# Patient Record
Sex: Female | Born: 1965 | Race: White | Hispanic: No | Marital: Married | Smoking: Former smoker
Health system: Southern US, Community
[De-identification: ages and names within clinical notes are randomized; demographics above are authoritative.]

## PROBLEM LIST (undated history)

## (undated) DIAGNOSIS — F32A Depression, unspecified: Secondary | ICD-10-CM

## (undated) DIAGNOSIS — T4145XA Adverse effect of unspecified anesthetic, initial encounter: Secondary | ICD-10-CM

## (undated) DIAGNOSIS — F419 Anxiety disorder, unspecified: Secondary | ICD-10-CM

## (undated) DIAGNOSIS — T8859XA Other complications of anesthesia, initial encounter: Secondary | ICD-10-CM

## (undated) DIAGNOSIS — Z9889 Other specified postprocedural states: Secondary | ICD-10-CM

## (undated) DIAGNOSIS — E039 Hypothyroidism, unspecified: Secondary | ICD-10-CM

## (undated) DIAGNOSIS — G709 Myoneural disorder, unspecified: Secondary | ICD-10-CM

## (undated) DIAGNOSIS — R112 Nausea with vomiting, unspecified: Secondary | ICD-10-CM

## (undated) DIAGNOSIS — E119 Type 2 diabetes mellitus without complications: Secondary | ICD-10-CM

## (undated) HISTORY — PX: EYE SURGERY: SHX253

## (undated) HISTORY — PX: HYSTEROSCOPY WITH NOVASURE: SHX5574

## (undated) HISTORY — DX: Depression, unspecified: F32.A

## (undated) HISTORY — PX: TONSILLECTOMY: SUR1361

## (undated) HISTORY — PX: OTHER SURGICAL HISTORY: SHX169

---
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
## 1997-10-01 ENCOUNTER — Other Ambulatory Visit: Admission: RE | Admit: 1997-10-01 | Discharge: 1997-10-01 | Payer: Self-pay | Admitting: Obstetrics & Gynecology

## 2000-10-04 ENCOUNTER — Encounter: Payer: Self-pay | Admitting: Obstetrics and Gynecology

## 2000-10-04 ENCOUNTER — Encounter: Admission: RE | Admit: 2000-10-04 | Discharge: 2000-10-04 | Payer: Self-pay | Admitting: Obstetrics and Gynecology

## 2003-04-22 ENCOUNTER — Other Ambulatory Visit: Admission: RE | Admit: 2003-04-22 | Discharge: 2003-04-22 | Payer: Self-pay | Admitting: Obstetrics and Gynecology

## 2003-12-03 ENCOUNTER — Ambulatory Visit: Payer: Self-pay | Admitting: Family Medicine

## 2004-04-20 ENCOUNTER — Other Ambulatory Visit: Admission: RE | Admit: 2004-04-20 | Discharge: 2004-04-20 | Payer: Self-pay | Admitting: Obstetrics and Gynecology

## 2004-05-31 ENCOUNTER — Ambulatory Visit: Payer: Self-pay | Admitting: Family Medicine

## 2004-07-01 ENCOUNTER — Encounter: Admission: RE | Admit: 2004-07-01 | Discharge: 2004-07-01 | Payer: Self-pay | Admitting: Obstetrics and Gynecology

## 2004-07-09 ENCOUNTER — Encounter: Admission: RE | Admit: 2004-07-09 | Discharge: 2004-07-09 | Payer: Self-pay | Admitting: Obstetrics and Gynecology

## 2004-12-23 ENCOUNTER — Ambulatory Visit: Payer: Self-pay | Admitting: Family Medicine

## 2005-06-17 ENCOUNTER — Other Ambulatory Visit: Admission: RE | Admit: 2005-06-17 | Discharge: 2005-06-17 | Payer: Self-pay | Admitting: Obstetrics and Gynecology

## 2005-07-29 ENCOUNTER — Encounter: Admission: RE | Admit: 2005-07-29 | Discharge: 2005-07-29 | Payer: Self-pay | Admitting: Obstetrics and Gynecology

## 2005-08-09 ENCOUNTER — Encounter: Admission: RE | Admit: 2005-08-09 | Discharge: 2005-08-09 | Payer: Self-pay | Admitting: Obstetrics and Gynecology

## 2005-12-01 ENCOUNTER — Ambulatory Visit (HOSPITAL_COMMUNITY): Admission: RE | Admit: 2005-12-01 | Discharge: 2005-12-01 | Payer: Self-pay | Admitting: Obstetrics and Gynecology

## 2006-02-15 ENCOUNTER — Encounter: Admission: RE | Admit: 2006-02-15 | Discharge: 2006-02-15 | Payer: Self-pay | Admitting: Obstetrics and Gynecology

## 2006-09-21 ENCOUNTER — Encounter: Admission: RE | Admit: 2006-09-21 | Discharge: 2006-09-21 | Payer: Self-pay | Admitting: Obstetrics and Gynecology

## 2007-09-24 ENCOUNTER — Encounter: Admission: RE | Admit: 2007-09-24 | Discharge: 2007-09-24 | Payer: Self-pay | Admitting: Obstetrics and Gynecology

## 2007-09-26 ENCOUNTER — Encounter: Admission: RE | Admit: 2007-09-26 | Discharge: 2007-09-26 | Payer: Self-pay | Admitting: Obstetrics and Gynecology

## 2008-03-18 ENCOUNTER — Encounter (HOSPITAL_COMMUNITY): Admission: RE | Admit: 2008-03-18 | Discharge: 2008-06-16 | Payer: Self-pay | Admitting: Internal Medicine

## 2009-04-21 ENCOUNTER — Encounter: Admission: RE | Admit: 2009-04-21 | Discharge: 2009-04-21 | Payer: Self-pay | Admitting: Obstetrics and Gynecology

## 2010-02-14 ENCOUNTER — Encounter: Payer: Self-pay | Admitting: Internal Medicine

## 2010-04-06 ENCOUNTER — Other Ambulatory Visit: Payer: Self-pay | Admitting: Obstetrics and Gynecology

## 2010-04-06 DIAGNOSIS — Z1231 Encounter for screening mammogram for malignant neoplasm of breast: Secondary | ICD-10-CM

## 2010-04-26 ENCOUNTER — Ambulatory Visit: Payer: Self-pay

## 2010-05-18 ENCOUNTER — Ambulatory Visit
Admission: RE | Admit: 2010-05-18 | Discharge: 2010-05-18 | Disposition: A | Discharge status: Home / Self Care | Payer: PRIVATE HEALTH INSURANCE | Source: Ambulatory Visit | Attending: Obstetrics and Gynecology | Admitting: Obstetrics and Gynecology

## 2010-05-18 DIAGNOSIS — Z1231 Encounter for screening mammogram for malignant neoplasm of breast: Secondary | ICD-10-CM

## 2010-06-11 NOTE — Op Note (Signed)
NAMESHACOYA, BURKHAMMER                 ACCOUNT NO.:  [id number]   MEDICAL RECORD NO.:  [id number]          PATIENT TYPE:  AMB   LOCATION:  SDC                           FACILITY:  [facility]   PHYSICIAN:  Huel Cote, M.D. DATE OF BIRTH:  [birth year]   DATE OF PROCEDURE:  12/01/2005  DATE OF DISCHARGE:                                 OPERATIVE REPORT   PREOPERATIVE DIAGNOSES:  1. Menorrhagia.  2. Abnormal uterine bleeding.   POSTOPERATIVE DIAGNOSES:  1. Menorrhagia.  2. Abnormal uterine bleeding.   PROCEDURE:  Diagnostic hysteroscopy and Novasure endometrial ablation.   SURGEON:  Dr. Huel Cote.   ANESTHESIA:  MAC with a local lidocaine 1% block.   COMPLICATIONS:  None.   ESTIMATED BLOOD LOSS:  Minimal.   HYSTEROSCOPIC DEFICIT:  35 mL.   URINE OUTPUT:  About 50 mL straight cath prior to procedure.   Uterus sounded to approximately 7.5 cm, cervix measured 3 cm.  There is a  normal uterine cavity noted and proliferative appearing endometrium.  After  the procedure there was good blanching noted in the entire fundal area.   PROCEDURE:  The patient was taken to operating room where MAC anesthesia was  obtained without difficulty.  She was then prepped and draped normal sterile  fashion in dorsal lithotomy position.  A speculum was placed within the  vagina and cervix grasped with single toothed tenaculum on the anterior lip  and injected with approximately 17 mL of 1% plain lidocaine for local  cervical block.  The uterus was then sounded and the cervix measured with  measurements as previously stated.  Thus the cavity length was set at 4.5  and the cervix dilated up to approximately 21.  The diagnostic hysteroscope  was then introduced into the uterus and the cavity carefully inspected.  It  was normal shape cavity has had been predetermined on saline infusion  ultrasound.  There did not appear to be any polyps or fibroids or uterine  septation noted.  The endometrium did  appear proliferative.  The cavity  itself was not very large.  The hysteroscope was then removed and the  Novasure unit introduced into the uterine fundus.  It was opened and with  manipulation opened to a cavity width of approximately 3.8.  The test was  then run with the cervical seal  in place and passed and thus the Novasure  unit was activated.  Treatment time was approximately a minute and 27  seconds.  At the conclusion of the treatment the Novasure unit was removed  carefully.  The hysteroscope was reintroduced into the cavity which appeared  well blanched in the entire fundal area.  There was no  evidence of any perforation or injury and therefore the hysteroscope was  removed and the patient had the speculum and tenaculum removed with no  active bleeding noted.  She was then awakened and taken to the recovery room  in stable condition      Huel Cote, M.D.  Electronically Signed     KR/MEDQ  D:  12/01/2005  T:  12/01/2005  Job:  [id number] 

## 2010-06-11 NOTE — H&P (Signed)
Whitney Donovan, Whitney Donovan                 ACCOUNT NO.:  [id number]   MEDICAL RECORD NO.:  [id number]          PATIENT TYPE:  AMB   LOCATION:  SDC                           FACILITY:  [facility]   PHYSICIAN:  Huel Cote, M.D. DATE OF BIRTH:  1965/11/03   DATE OF ADMISSION:  12/01/2005  DATE OF DISCHARGE:                                HISTORY & PHYSICAL   HISTORY:  The patient is a 45 year old G-2,  P-2 who is coming in for a  scheduled NovaSure endometrial ablation, given the finding of abnormal  uterine bleeding and a desire to control this and her menorrhagia symptoms.  The patient has a history of menorrhagia, which she had controlled in the  past with oral contraceptives; however, as she began smoking, she had to  come off her pills, and after that time her periods were approximately 19  days apart, lasting for six to seven days.  The patient had an evaluation in  the office with a saline infusion ultrasound and an endometrial biopsy which  was negative.  The patient at that time was noted to have a normal uterine  cavity, with one small finger fibroid in the uterus, which was less than 1  mm in size.  All else appeared normal.  Given her heavy cycles which are  becoming closer together, the patient wished definitive surgical treatment,  and we discussed the option of endometrial ablation, which she desired to  proceed with.   PAST MEDICAL HISTORY:  None.   PAST SURGICAL HISTORY:  D&C and a C-section x2.   PAST OBSTETRICAL HISTORY:  A C-section x2.   PAST GYN HISTORY:  Remote history of an abnormal Pap smear.   FAMILY HISTORY:  Breast cancer in three aunts.  No colon cancer.   MEDICATIONS:  She is currently on no medications.   ALLERGIES:  PENICILLIN, CODEINE AND AN INTOLERANCE TO NON-STEROIDALS.   PHYSICAL EXAMINATION:  VITAL SIGNS:  Blood pressure 110/80, weight 183  pounds.  HEART:  A regular rate and rhythm.  LUNGS:  Clear.  ABDOMEN:  Soft, nontender.  PELVIC:  She has  normal genitalia noted.  Cervix is normal.  Uterus is  normal in size.  Adnexa have no masses.   The patient was counseled as to the risks and benefits of NovaSure ablation,  including bleeding and possible uterine perforation.  She understands the  risks associated with the procedure.  We also discussed that while this will  likely  not render her completely amenorrheic, that it should shorten and lighten  her cycles considerably and just improvement is our ultimate goal.  The  patient was instructed on how to use Cytotec three to four hours prior to  her treatment and will place this prior to the procedure.      Huel Cote, M.D.  Electronically Signed     KR/MEDQ  D:  11/30/2005  T:  11/30/2005  Job:  [id number]

## 2011-06-14 ENCOUNTER — Other Ambulatory Visit: Payer: Self-pay | Admitting: Obstetrics and Gynecology

## 2011-06-14 DIAGNOSIS — Z1231 Encounter for screening mammogram for malignant neoplasm of breast: Secondary | ICD-10-CM

## 2011-06-30 ENCOUNTER — Ambulatory Visit: Payer: PRIVATE HEALTH INSURANCE

## 2011-07-05 ENCOUNTER — Ambulatory Visit
Admission: RE | Admit: 2011-07-05 | Discharge: 2011-07-05 | Disposition: A | Discharge status: Home / Self Care | Payer: PRIVATE HEALTH INSURANCE | Source: Ambulatory Visit | Attending: Obstetrics and Gynecology | Admitting: Obstetrics and Gynecology

## 2011-07-05 DIAGNOSIS — Z1231 Encounter for screening mammogram for malignant neoplasm of breast: Secondary | ICD-10-CM

## 2011-11-29 ENCOUNTER — Ambulatory Visit: Payer: Self-pay | Admitting: Ophthalmology

## 2012-07-13 ENCOUNTER — Other Ambulatory Visit: Payer: Self-pay

## 2012-07-13 DIAGNOSIS — Z1231 Encounter for screening mammogram for malignant neoplasm of breast: Secondary | ICD-10-CM

## 2012-07-17 ENCOUNTER — Ambulatory Visit
Admission: RE | Admit: 2012-07-17 | Discharge: 2012-07-17 | Disposition: A | Discharge status: Home / Self Care | Payer: PRIVATE HEALTH INSURANCE | Source: Ambulatory Visit

## 2012-07-17 DIAGNOSIS — Z1231 Encounter for screening mammogram for malignant neoplasm of breast: Secondary | ICD-10-CM

## 2013-08-05 ENCOUNTER — Other Ambulatory Visit: Payer: Self-pay

## 2013-08-05 DIAGNOSIS — Z1231 Encounter for screening mammogram for malignant neoplasm of breast: Secondary | ICD-10-CM

## 2013-09-05 ENCOUNTER — Ambulatory Visit
Admission: RE | Admit: 2013-09-05 | Discharge: 2013-09-05 | Disposition: A | Discharge status: Home / Self Care | Payer: PRIVATE HEALTH INSURANCE | Source: Ambulatory Visit

## 2013-09-05 DIAGNOSIS — Z1231 Encounter for screening mammogram for malignant neoplasm of breast: Secondary | ICD-10-CM

## 2014-10-15 ENCOUNTER — Other Ambulatory Visit: Payer: Self-pay

## 2014-10-15 DIAGNOSIS — Z1231 Encounter for screening mammogram for malignant neoplasm of breast: Secondary | ICD-10-CM

## 2014-10-27 ENCOUNTER — Ambulatory Visit
Admission: RE | Admit: 2014-10-27 | Discharge: 2014-10-27 | Disposition: A | Discharge status: Home / Self Care | Payer: 59 | Source: Ambulatory Visit

## 2014-10-27 DIAGNOSIS — Z1231 Encounter for screening mammogram for malignant neoplasm of breast: Secondary | ICD-10-CM

## 2015-09-21 ENCOUNTER — Other Ambulatory Visit: Payer: Self-pay | Admitting: Obstetrics and Gynecology

## 2015-09-21 DIAGNOSIS — Z1231 Encounter for screening mammogram for malignant neoplasm of breast: Secondary | ICD-10-CM

## 2015-10-28 ENCOUNTER — Ambulatory Visit
Admission: RE | Admit: 2015-10-28 | Discharge: 2015-10-28 | Disposition: A | Discharge status: Home / Self Care | Payer: 59 | Source: Ambulatory Visit | Attending: Obstetrics and Gynecology | Admitting: Obstetrics and Gynecology

## 2015-10-28 DIAGNOSIS — Z1231 Encounter for screening mammogram for malignant neoplasm of breast: Secondary | ICD-10-CM

## 2015-11-03 ENCOUNTER — Other Ambulatory Visit: Payer: Self-pay | Admitting: Obstetrics and Gynecology

## 2015-11-03 DIAGNOSIS — R928 Other abnormal and inconclusive findings on diagnostic imaging of breast: Secondary | ICD-10-CM

## 2015-11-06 ENCOUNTER — Ambulatory Visit
Admission: RE | Admit: 2015-11-06 | Discharge: 2015-11-06 | Disposition: A | Discharge status: Home / Self Care | Payer: 59 | Source: Ambulatory Visit | Attending: Obstetrics and Gynecology | Admitting: Obstetrics and Gynecology

## 2015-11-06 DIAGNOSIS — R928 Other abnormal and inconclusive findings on diagnostic imaging of breast: Secondary | ICD-10-CM

## 2016-11-29 ENCOUNTER — Other Ambulatory Visit: Payer: Self-pay | Admitting: Obstetrics and Gynecology

## 2016-11-29 DIAGNOSIS — Z1231 Encounter for screening mammogram for malignant neoplasm of breast: Secondary | ICD-10-CM

## 2016-12-26 ENCOUNTER — Ambulatory Visit: Payer: 59

## 2017-01-12 ENCOUNTER — Ambulatory Visit
Admission: RE | Admit: 2017-01-12 | Discharge: 2017-01-12 | Disposition: A | Discharge status: Home / Self Care | Payer: 59 | Source: Ambulatory Visit | Attending: Obstetrics and Gynecology | Admitting: Obstetrics and Gynecology

## 2017-01-12 DIAGNOSIS — Z1231 Encounter for screening mammogram for malignant neoplasm of breast: Secondary | ICD-10-CM

## 2018-02-20 ENCOUNTER — Other Ambulatory Visit: Payer: Self-pay | Admitting: Obstetrics and Gynecology

## 2018-02-20 DIAGNOSIS — Z1231 Encounter for screening mammogram for malignant neoplasm of breast: Secondary | ICD-10-CM

## 2018-03-13 ENCOUNTER — Ambulatory Visit: Payer: 59

## 2018-04-06 ENCOUNTER — Ambulatory Visit: Payer: 59

## 2018-04-27 ENCOUNTER — Ambulatory Visit: Payer: 59

## 2018-07-03 ENCOUNTER — Ambulatory Visit
Admission: RE | Admit: 2018-07-03 | Discharge: 2018-07-03 | Disposition: A | Discharge status: Home / Self Care | Payer: Managed Care, Other (non HMO) | Source: Ambulatory Visit | Attending: Obstetrics and Gynecology | Admitting: Obstetrics and Gynecology

## 2018-07-03 ENCOUNTER — Other Ambulatory Visit: Payer: Self-pay

## 2018-07-03 DIAGNOSIS — Z1231 Encounter for screening mammogram for malignant neoplasm of breast: Secondary | ICD-10-CM

## 2018-10-08 ENCOUNTER — Other Ambulatory Visit: Payer: Self-pay | Admitting: Specialist

## 2018-10-10 ENCOUNTER — Encounter
Admission: RE | Admit: 2018-10-10 | Discharge: 2018-10-10 | Disposition: A | Discharge status: Home / Self Care | Payer: Managed Care, Other (non HMO) | Source: Ambulatory Visit | Attending: Specialist | Admitting: Specialist

## 2018-10-10 ENCOUNTER — Other Ambulatory Visit: Payer: Self-pay

## 2018-10-10 DIAGNOSIS — Z01818 Encounter for other preprocedural examination: Secondary | ICD-10-CM | POA: Insufficient documentation

## 2018-10-10 DIAGNOSIS — Z20828 Contact with and (suspected) exposure to other viral communicable diseases: Secondary | ICD-10-CM | POA: Insufficient documentation

## 2018-10-10 DIAGNOSIS — G5601 Carpal tunnel syndrome, right upper limb: Secondary | ICD-10-CM | POA: Insufficient documentation

## 2018-10-10 HISTORY — DX: Nausea with vomiting, unspecified: R11.2

## 2018-10-10 HISTORY — DX: Type 2 diabetes mellitus without complications: E11.9

## 2018-10-10 HISTORY — DX: Anxiety disorder, unspecified: F41.9

## 2018-10-10 HISTORY — DX: Other complications of anesthesia, initial encounter: T88.59XA

## 2018-10-10 HISTORY — DX: Other specified postprocedural states: Z98.890

## 2018-10-10 HISTORY — DX: Hypothyroidism, unspecified: E03.9

## 2018-10-10 NOTE — Patient Instructions (Signed)
Your procedure is scheduled on: 10/15/2018 Mon Report to Same Day Surgery 2nd floor medical mall [facility] Entrance-take elevator on left to 2nd floor.  Check in with surgery information desk.) To find out your arrival time please call [phone] between 1PM - 3PM on 10/12/2018 Fri  Remember: Instructions that are not followed completely may result in serious medical risk, up to and including death, or upon the discretion of your surgeon and anesthesiologist your surgery may need to be rescheduled.    _x___ 1. Do not eat food after midnight the night before your procedure. You may drink clear liquids up to 2 hours before you are scheduled to arrive at the hospital for your procedure.  Do not drink clear liquids within 2 hours of your scheduled arrival to the hospital.  Clear liquids include  --Water or Apple juice without pulp  --Clear carbohydrate beverage such as ClearFast or Gatorade  --Black Coffee or Clear Tea (No milk, no creamers, do not add anything to                  the coffee or Tea Type 1 and type 2 diabetics should only drink water.   ____Ensure clear carbohydrate drink on the way to the hospital for bariatric patients  ____Ensure clear carbohydrate drink 3 hours before surgery.   No gum chewing or hard candies.     __x__ 2. No Alcohol for 24 hours before or after surgery.   __x__3. No Smoking or e-cigarettes for 24 prior to surgery.  Do not use any chewable tobacco products for at least 6 hour prior to surgery   ____  4. Bring all medications with you on the day of surgery if instructed.    __x__ 5. Notify your doctor if there is any change in your medical condition     (cold, fever, infections).    x___6. On the morning of surgery brush your teeth with toothpaste and water.  You may rinse your mouth with mouth wash if you wish.  Do not swallow any toothpaste or mouthwash.   Do not wear jewelry, make-up, hairpins, clips or nail polish.  Do not wear lotions,  powders, or perfumes. You may wear deodorant.  Do not shave 48 hours prior to surgery. Men may shave face and neck.  Do not bring valuables to the hospital.    [facility] is not responsible for any belongings or valuables.               Contacts, dentures or bridgework may not be worn into surgery.  Leave your suitcase in the car. After surgery it may be brought to your room.  For patients admitted to the hospital, discharge time is determined by your                       treatment team.  _  Patients discharged the day of surgery will not be allowed to drive home.  You will need someone to drive you home and stay with you the night of your procedure.    Please read over the following fact sheets that you were given:   [facility] Preparing for Surgery and or MRSA Information   _x___ Take anti-hypertensive listed below, cardiac, seizure, asthma,     anti-reflux and psychiatric medicines. These include:  1. thyroid (ARMOUR) 120 MG tablet  2.  3.  4.  5.  6.  ____Fleets enema or Magnesium Citrate as directed.   _x___  Use CHG Soap or sage wipes as directed on instruction sheet   ____ Use inhalers on the day of surgery and bring to hospital day of surgery  _x___ Stop Metformin and Janumet 2 days prior to surgery.    ____ Take 1/2 of usual insulin dose the night before surgery and none on the morning     surgery.   _x___ Follow recommendations from Cardiologist, Pulmonologist or PCP regarding          stopping Aspirin, Coumadin, Plavix ,Eliquis, Effient, or Pradaxa, and Pletal.  X____Stop Anti-inflammatories such as Advil, Aleve, Ibuprofen, Motrin, Naproxen, Naprosyn, Goodies powders or aspirin products. OK to take Tylenol and                          Celebrex.   _x___ Stop supplements until after surgery.  But may continue Vitamin D, Vitamin B,       and multivitamin.   ____ Bring C-Pap to the hospital.

## 2018-10-11 ENCOUNTER — Other Ambulatory Visit: Admission: RE | Admit: 2018-10-11 | Payer: Managed Care, Other (non HMO) | Source: Ambulatory Visit

## 2018-10-11 ENCOUNTER — Other Ambulatory Visit: Payer: Self-pay

## 2018-10-11 ENCOUNTER — Encounter
Admission: RE | Admit: 2018-10-11 | Discharge: 2018-10-11 | Disposition: A | Discharge status: Home / Self Care | Payer: Managed Care, Other (non HMO) | Source: Ambulatory Visit | Attending: Specialist | Admitting: Specialist

## 2018-10-11 DIAGNOSIS — Z20828 Contact with and (suspected) exposure to other viral communicable diseases: Secondary | ICD-10-CM | POA: Diagnosis not present

## 2018-10-11 DIAGNOSIS — G5601 Carpal tunnel syndrome, right upper limb: Secondary | ICD-10-CM | POA: Diagnosis not present

## 2018-10-11 DIAGNOSIS — Z01818 Encounter for other preprocedural examination: Secondary | ICD-10-CM | POA: Diagnosis not present

## 2018-10-11 LAB — SARS CORONAVIRUS 2 (TAT 6-24 HRS): SARS Coronavirus 2: NEGATIVE

## 2018-10-14 MED ORDER — CLINDAMYCIN PHOSPHATE 600 MG/50ML IV SOLN
600.0000 mg | INTRAVENOUS | Status: AC
  Administered 2018-10-15: 10:00:00 600 mg via INTRAVENOUS

## 2018-10-15 ENCOUNTER — Encounter
Admission: RE | Disposition: A | Discharge status: Home / Self Care | Payer: Self-pay | Source: Home / Self Care | Attending: Specialist

## 2018-10-15 ENCOUNTER — Ambulatory Visit
Admission: RE | Admit: 2018-10-15 | Discharge: 2018-10-15 | Disposition: A | Discharge status: Home / Self Care | Payer: Managed Care, Other (non HMO) | Attending: Specialist | Admitting: Specialist

## 2018-10-15 ENCOUNTER — Encounter: Payer: Self-pay | Admitting: *Deleted

## 2018-10-15 ENCOUNTER — Ambulatory Visit: Payer: Managed Care, Other (non HMO) | Admitting: Anesthesiology

## 2018-10-15 ENCOUNTER — Other Ambulatory Visit: Payer: Self-pay

## 2018-10-15 DIAGNOSIS — G5601 Carpal tunnel syndrome, right upper limb: Secondary | ICD-10-CM | POA: Insufficient documentation

## 2018-10-15 DIAGNOSIS — E119 Type 2 diabetes mellitus without complications: Secondary | ICD-10-CM | POA: Diagnosis not present

## 2018-10-15 DIAGNOSIS — F17200 Nicotine dependence, unspecified, uncomplicated: Secondary | ICD-10-CM | POA: Diagnosis not present

## 2018-10-15 HISTORY — PX: CARPAL TUNNEL RELEASE: SHX101

## 2018-10-15 LAB — GLUCOSE, CAPILLARY
Glucose-Capillary: 107 mg/dL — ABNORMAL HIGH (ref 70–99)
Glucose-Capillary: 97 mg/dL (ref 70–99)

## 2018-10-15 LAB — POCT PREGNANCY, URINE: Preg Test, Ur: NEGATIVE

## 2018-10-15 SURGERY — CARPAL TUNNEL RELEASE
Anesthesia: General | Site: Wrist | Laterality: Right

## 2018-10-15 MED ORDER — LIDOCAINE HCL (CARDIAC) PF 100 MG/5ML IV SOSY
PREFILLED_SYRINGE | INTRAVENOUS | Status: DC | PRN
  Administered 2018-10-15: 100 mg via INTRAVENOUS

## 2018-10-15 MED ORDER — LACTATED RINGERS IV SOLN
INTRAVENOUS | Status: DC | PRN
  Administered 2018-10-15: 10:00:00 via INTRAVENOUS

## 2018-10-15 MED ORDER — BUPIVACAINE HCL (PF) 0.5 % IJ SOLN
INTRAMUSCULAR | Status: AC
  Filled 2018-10-15: qty 30

## 2018-10-15 MED ORDER — FENTANYL CITRATE (PF) 100 MCG/2ML IJ SOLN
INTRAMUSCULAR | Status: DC | PRN
  Administered 2018-10-15: 50 ug via INTRAVENOUS

## 2018-10-15 MED ORDER — PROPOFOL 10 MG/ML IV BOLUS
INTRAVENOUS | Status: AC
  Filled 2018-10-15: qty 20

## 2018-10-15 MED ORDER — PHENYLEPHRINE HCL (PRESSORS) 10 MG/ML IV SOLN
INTRAVENOUS | Status: DC | PRN
  Administered 2018-10-15 (×2): 100 ug via INTRAVENOUS

## 2018-10-15 MED ORDER — SCOPOLAMINE 1 MG/3DAYS TD PT72
MEDICATED_PATCH | TRANSDERMAL | Status: AC
  Filled 2018-10-15: qty 1

## 2018-10-15 MED ORDER — DEXAMETHASONE SODIUM PHOSPHATE 10 MG/ML IJ SOLN
INTRAMUSCULAR | Status: DC | PRN
  Administered 2018-10-15: 10 mg via INTRAVENOUS

## 2018-10-15 MED ORDER — MELOXICAM 7.5 MG PO TABS
15.0000 mg | ORAL_TABLET | ORAL | Status: AC
  Administered 2018-10-15: 15 mg via ORAL

## 2018-10-15 MED ORDER — FAMOTIDINE 20 MG PO TABS
20.0000 mg | ORAL_TABLET | Freq: Once | ORAL | Status: AC
  Administered 2018-10-15: 20 mg via ORAL

## 2018-10-15 MED ORDER — SODIUM CHLORIDE 0.9 % IV SOLN
INTRAVENOUS | Status: DC
  Administered 2018-10-15: 09:00:00 via INTRAVENOUS

## 2018-10-15 MED ORDER — PROMETHAZINE HCL 25 MG/ML IJ SOLN: 6.2500 mg | INTRAMUSCULAR | Status: DC | PRN

## 2018-10-15 MED ORDER — FAMOTIDINE 20 MG PO TABS
ORAL_TABLET | ORAL | Status: AC
  Filled 2018-10-15: qty 1

## 2018-10-15 MED ORDER — GABAPENTIN 300 MG PO CAPS
ORAL_CAPSULE | ORAL | Status: AC
  Filled 2018-10-15: qty 1

## 2018-10-15 MED ORDER — FENTANYL CITRATE (PF) 100 MCG/2ML IJ SOLN
INTRAMUSCULAR | Status: AC
  Filled 2018-10-15: qty 2

## 2018-10-15 MED ORDER — GABAPENTIN 300 MG PO CAPS
300.0000 mg | ORAL_CAPSULE | ORAL | Status: AC
  Administered 2018-10-15: 300 mg via ORAL

## 2018-10-15 MED ORDER — SCOPOLAMINE 1 MG/3DAYS TD PT72
1.0000 | MEDICATED_PATCH | TRANSDERMAL | Status: DC
  Administered 2018-10-15: 1.5 mg via TRANSDERMAL

## 2018-10-15 MED ORDER — HYDROCODONE-ACETAMINOPHEN 5-325 MG PO TABS: 1.0000 | ORAL_TABLET | Freq: Four times a day (QID) | ORAL | 0 refills | Status: DC | PRN

## 2018-10-15 MED ORDER — GABAPENTIN 400 MG PO CAPS: 400.0000 mg | ORAL_CAPSULE | Freq: Three times a day (TID) | ORAL | 3 refills | Status: DC

## 2018-10-15 MED ORDER — CLINDAMYCIN PHOSPHATE 600 MG/50ML IV SOLN
INTRAVENOUS | Status: AC
  Filled 2018-10-15: qty 50

## 2018-10-15 MED ORDER — MELOXICAM 7.5 MG PO TABS
ORAL_TABLET | ORAL | Status: AC
  Filled 2018-10-15: qty 2

## 2018-10-15 MED ORDER — ACETAMINOPHEN 10 MG/ML IV SOLN
INTRAVENOUS | Status: AC
  Filled 2018-10-15: qty 100

## 2018-10-15 MED ORDER — ACETAMINOPHEN 10 MG/ML IV SOLN
INTRAVENOUS | Status: DC | PRN
  Administered 2018-10-15: 1000 mg via INTRAVENOUS

## 2018-10-15 MED ORDER — CHLORHEXIDINE GLUCONATE CLOTH 2 % EX PADS: 6.0000 | MEDICATED_PAD | Freq: Once | CUTANEOUS | Status: DC

## 2018-10-15 MED ORDER — MIDAZOLAM HCL 2 MG/2ML IJ SOLN
INTRAMUSCULAR | Status: DC | PRN
  Administered 2018-10-15: 2 mg via INTRAVENOUS

## 2018-10-15 MED ORDER — MIDAZOLAM HCL 2 MG/2ML IJ SOLN
INTRAMUSCULAR | Status: AC
  Filled 2018-10-15: qty 2

## 2018-10-15 MED ORDER — MELOXICAM 15 MG PO TABS: 15.0000 mg | ORAL_TABLET | Freq: Every day | ORAL | 3 refills | Status: DC

## 2018-10-15 MED ORDER — PROPOFOL 10 MG/ML IV BOLUS
INTRAVENOUS | Status: DC | PRN
  Administered 2018-10-15: 25 mg via INTRAVENOUS
  Administered 2018-10-15: 150 mg via INTRAVENOUS

## 2018-10-15 MED ORDER — BUPIVACAINE HCL 0.5 % IJ SOLN
INTRAMUSCULAR | Status: DC | PRN
  Administered 2018-10-15: 7 mL

## 2018-10-15 MED ORDER — ONDANSETRON HCL 4 MG/2ML IJ SOLN
INTRAMUSCULAR | Status: DC | PRN
  Administered 2018-10-15 (×2): 4 mg via INTRAVENOUS

## 2018-10-15 MED ORDER — FENTANYL CITRATE (PF) 100 MCG/2ML IJ SOLN: 25.0000 ug | INTRAMUSCULAR | Status: DC | PRN

## 2018-10-15 SURGICAL SUPPLY — 28 items
BLADE SURG MINI STRL (BLADE) ×2 IMPLANT
BNDG ESMARK 4X12 TAN STRL LF (GAUZE/BANDAGES/DRESSINGS) ×2 IMPLANT
CANISTER SUCT 1200ML W/VALVE (MISCELLANEOUS) ×2 IMPLANT
CHLORAPREP W/TINT 26 (MISCELLANEOUS) ×2 IMPLANT
COVER WAND RF STERILE (DRAPES) ×2 IMPLANT
CUFF TOURN SGL QUICK 18X4 (TOURNIQUET CUFF) IMPLANT
DRSG GAUZE FLUFF 36X18 (GAUZE/BANDAGES/DRESSINGS) ×2 IMPLANT
ELECT REM PT RETURN 9FT ADLT (ELECTROSURGICAL) ×2
ELECTRODE REM PT RTRN 9FT ADLT (ELECTROSURGICAL) ×1 IMPLANT
GAUZE XEROFORM 1X8 LF (GAUZE/BANDAGES/DRESSINGS) ×2 IMPLANT
GLOVE BIO SURGEON STRL SZ8 (GLOVE) ×2 IMPLANT
GOWN STRL REUS W/ TWL LRG LVL3 (GOWN DISPOSABLE) ×1 IMPLANT
GOWN STRL REUS W/TWL LRG LVL3 (GOWN DISPOSABLE) ×1
GOWN STRL REUS W/TWL LRG LVL4 (GOWN DISPOSABLE) ×2 IMPLANT
KIT TURNOVER KIT A (KITS) ×2 IMPLANT
NS IRRIG 500ML POUR BTL (IV SOLUTION) ×2 IMPLANT
PACK EXTREMITY ARMC (MISCELLANEOUS) ×2 IMPLANT
PAD PREP 24X41 OB/GYN DISP (PERSONAL CARE ITEMS) ×2 IMPLANT
PADDING CAST 4IN STRL (MISCELLANEOUS) ×1
PADDING CAST BLEND 4X4 STRL (MISCELLANEOUS) ×1 IMPLANT
SPLINT CAST 1 STEP 3X12 (MISCELLANEOUS) ×2 IMPLANT
STOCKINETTE 48X4 2 PLY STRL (GAUZE/BANDAGES/DRESSINGS) ×1 IMPLANT
STOCKINETTE BIAS CUT 4 980044 (GAUZE/BANDAGES/DRESSINGS) ×2 IMPLANT
STOCKINETTE STRL 4IN 9604848 (GAUZE/BANDAGES/DRESSINGS) ×2 IMPLANT
SUT ETHILON 4-0 (SUTURE) ×1
SUT ETHILON 4-0 FS2 18XMFL BLK (SUTURE) ×1
SUT ETHILON 5-0 FS-2 18 BLK (SUTURE) ×2 IMPLANT
SUTURE ETHLN 4-0 FS2 18XMF BLK (SUTURE) ×1 IMPLANT

## 2018-10-15 NOTE — Addendum Note (Signed)
Addendum  created 10/15/18 1334 by Nelda Marseille, CRNA   Intraprocedure Meds edited

## 2018-10-15 NOTE — Anesthesia Procedure Notes (Signed)
Procedure Name: LMA Insertion Date/Time: 10/15/2018 10:14 AM Performed by: Nelda Marseille, CRNA Pre-anesthesia Checklist: Patient identified, Patient being monitored, Timeout performed, Emergency Drugs available and Suction available Patient Re-evaluated:Patient Re-evaluated prior to induction Oxygen Delivery Method: Circle system utilized Preoxygenation: Pre-oxygenation with 100% oxygen Induction Type: IV induction Ventilation: Mask ventilation without difficulty LMA: LMA inserted LMA Size: 3.5 Tube type: Oral Number of attempts: 1 Placement Confirmation: positive ETCO2 and breath sounds checked- equal and bilateral Tube secured with: Tape Dental Injury: Teeth and Oropharynx as per pre-operative assessment

## 2018-10-15 NOTE — Transfer of Care (Signed)
Immediate Anesthesia Transfer of Care Note  Patient: Whitney Donovan  Procedure(s) Performed: CARPAL TUNNEL RELEASE (Right Wrist)  Patient Location: PACU  Anesthesia Type:General  Level of Consciousness: sedated  Airway & Oxygen Therapy: Patient Spontanous Breathing and Patient connected to face mask oxygen  Post-op Assessment: Report given to RN and Post -op Vital signs reviewed and stable  Post vital signs: Reviewed and stable  Last Vitals:  Vitals Value Taken Time  BP    Temp    Pulse 63 10/15/18 1041  Resp 21 10/15/18 1041  SpO2 99 % 10/15/18 1041  Vitals shown include unvalidated device data.  Last Pain:  Vitals:   10/15/18 0837  TempSrc: Oral  PainSc: 0-No pain         Complications: No apparent anesthesia complications

## 2018-10-15 NOTE — H&P (Signed)
THE PATIENT WAS SEEN PRIOR TO SURGERY TODAY.  HISTORY, ALLERGIES, HOME MEDICATIONS AND OPERATIVE PROCEDURE WERE REVIEWED. RISKS AND BENEFITS OF SURGERY DISCUSSED WITH PATIENT AGAIN.  NO CHANGES FROM INITIAL HISTORY AND PHYSICAL NOTED.    

## 2018-10-15 NOTE — Discharge Instructions (Signed)

## 2018-10-15 NOTE — Anesthesia Postprocedure Evaluation (Signed)
Anesthesia Post Note  Patient: Whitney Donovan  Procedure(s) Performed: CARPAL TUNNEL RELEASE (Right Wrist)  Patient location during evaluation: PACU Anesthesia Type: General Level of consciousness: awake and alert Pain management: pain level controlled Vital Signs Assessment: post-procedure vital signs reviewed and stable Respiratory status: spontaneous breathing, nonlabored ventilation, respiratory function stable and patient connected to nasal cannula oxygen Cardiovascular status: blood pressure returned to baseline and stable Postop Assessment: no apparent nausea or vomiting Anesthetic complications: no     Last Vitals:  Vitals:   10/15/18 1154 10/15/18 1200  BP: 115/60 118/64  Pulse: 68 63  Resp: 16 16  Temp:    SpO2: 97% 96%    Last Pain:  Vitals:   10/15/18 1200  TempSrc:   PainSc: 0-No pain                 Precious Haws Monifa Blanchette

## 2018-10-15 NOTE — Anesthesia Post-op Follow-up Note (Signed)
Anesthesia QCDR form completed.        

## 2018-10-15 NOTE — Anesthesia Preprocedure Evaluation (Signed)
Anesthesia Evaluation  Patient identified by MRN, date of birth, ID band Patient awake    Reviewed: Allergy & Precautions, H&P , NPO status , Patient's Chart, lab work & pertinent test results  History of Anesthesia Complications (+) PONV and history of anesthetic complications  Airway Mallampati: II  TM Distance: >3 FB Neck ROM: full    Dental  (+) Chipped   Pulmonary neg shortness of breath, Current Smoker,           Cardiovascular Exercise Tolerance: Good (-) angina(-) Past MI and (-) DOE negative cardio ROS       Neuro/Psych PSYCHIATRIC DISORDERS negative neurological ROS     GI/Hepatic negative GI ROS, Neg liver ROS, neg GERD  ,  Endo/Other  diabetes, Type 2Hypothyroidism   Renal/GU      Musculoskeletal   Abdominal   Peds  Hematology negative hematology ROS (+)   Anesthesia Other Findings Past Medical History: No date: Anxiety No date: Complication of anesthesia No date: Diabetes mellitus without complication (HCC) No date: Hypothyroidism No date: PONV (postoperative nausea and vomiting)  Past Surgical History: No date: CESAREAN SECTION No date: EYE SURGERY No date: HYSTEROSCOPY WITH NOVASURE No date: TONSILLECTOMY No date: uterine ablation  BMI    Body Mass Index: 26.41 kg/m      Reproductive/Obstetrics negative OB ROS                             Anesthesia Physical Anesthesia Plan  ASA: III  Anesthesia Plan: General LMA   Post-op Pain Management:    Induction: Intravenous  PONV Risk Score and Plan: Dexamethasone, Ondansetron, Midazolam, Treatment may vary due to age or medical condition and Scopolamine patch - Pre-op  Airway Management Planned: LMA  Additional Equipment:   Intra-op Plan:   Post-operative Plan: Extubation in OR  Informed Consent: I have reviewed the patients History and Physical, chart, labs and discussed the procedure including the  risks, benefits and alternatives for the proposed anesthesia with the patient or authorized representative who has indicated his/her understanding and acceptance.     Dental Advisory Given  Plan Discussed with: Anesthesiologist, CRNA and Surgeon  Anesthesia Plan Comments: (Patient consented for risks of anesthesia including but not limited to:  - adverse reactions to medications - damage to teeth, lips or other oral mucosa - sore throat or hoarseness - Damage to heart, brain, lungs or loss of life  Patient voiced understanding.)        Anesthesia Quick Evaluation

## 2018-10-15 NOTE — Op Note (Signed)
10/15/2018  10:36 AM  PATIENT:  Whitney Donovan    PRE-OPERATIVE DIAGNOSIS: RIGHT CARPAL TUNNEL SYNDROME POST-OPERATIVE DIAGNOSIS: RIGHT CARPAL TUNNEL SYNDROME  PROCEDURE:  RIGHT CARPAL TUNNEL RELEASE  SURGEON: Park Breed, MD    ANESTHESIA:   General  TOURNIQUET TIME: 20   MIN  PREOPERATIVE INDICATIONS:  Whitney Donovan is a  53 y.o. female with a diagnosis of right carpal tunnel syndrome who failed conservative measures and elected for surgical management.    The risks benefits and alternatives were discussed with the patient preoperatively including but not limited to the risks of infection, bleeding, nerve injury, incomplete relief of symptoms, pillar pain, cardiopulmonary complications, the need for revision surgery, among others, and the patient was willing to proceed.  OPERATIVE FINDINGS: Thickened volar ligament and nerve compression.  OPERATIVE PROCEDURE: The patient is brought to the operating room placed in the supine position. General anesthesia was administered. The right upper extremity was prepped and draped in usual sterile fashion. Time out was performed. The arm was elevated and exsanguinated and the tourniquet was inflated. Incision was made in line with the radial border of the ring finger. The carpal tunnel transverse fascia was identified, cleaned, and incised sharply. The common sensory branches were visualized along with the superficial palmar arch and protected.  The median nerve was protected below  A Kelly clamp was  placed underneath the transverse carpal ligament, protecting the nerve. I released the ligament completely, and then released the proximal distal volar forearm fascia. The nerve was identified, and visualized, and protected throughout the case. The motor branch was intact upon inspection.  No masses or abnormalities were identified in ulnar bursa.  The wounds were irrigated copiously, and the skin closed with nylon. The wound was injected with 1/2%  marcaine followed by a sterile dressing and  volar splint .  Tourniquet was deflated with good return of blood flow to all fingers. Sponge and needle counts were correct.  The patient tolerated this well, with no complications. The patient was awakened and taken to recovery in good condition.

## 2018-10-16 ENCOUNTER — Encounter: Payer: Self-pay | Admitting: Specialist

## 2018-12-24 ENCOUNTER — Other Ambulatory Visit: Payer: Self-pay | Admitting: Endocrinology

## 2018-12-24 DIAGNOSIS — R591 Generalized enlarged lymph nodes: Secondary | ICD-10-CM

## 2019-01-04 ENCOUNTER — Other Ambulatory Visit: Payer: Self-pay

## 2019-01-04 ENCOUNTER — Ambulatory Visit
Admission: RE | Admit: 2019-01-04 | Discharge: 2019-01-04 | Disposition: A | Discharge status: Home / Self Care | Payer: Managed Care, Other (non HMO) | Source: Ambulatory Visit | Attending: Endocrinology | Admitting: Endocrinology

## 2019-01-04 DIAGNOSIS — R591 Generalized enlarged lymph nodes: Secondary | ICD-10-CM

## 2019-01-04 MED ORDER — IOHEXOL 300 MG/ML  SOLN
75.0000 mL | Freq: Once | INTRAMUSCULAR | Status: AC | PRN
  Administered 2019-01-04: 75 mL via INTRAVENOUS

## 2019-07-18 ENCOUNTER — Telehealth: Payer: Managed Care, Other (non HMO) | Admitting: Physician Assistant

## 2019-07-18 DIAGNOSIS — R21 Rash and other nonspecific skin eruption: Secondary | ICD-10-CM | POA: Diagnosis not present

## 2019-07-18 MED ORDER — TRIAMCINOLONE ACETONIDE 0.025 % EX OINT: 1.0000 "application " | TOPICAL_OINTMENT | Freq: Two times a day (BID) | CUTANEOUS | 0 refills | Status: DC

## 2019-07-18 NOTE — Progress Notes (Signed)
E Visit for Rash   Hi Whitney Donovan,  I am sorry you are not feeling well.  Please go ahead and schedule an appointment with your PCP for this problem.  I have prescribed a medication, but if you are not improving, you will need to be seen in person for a physical exam.   I have prescribed a topical medication for your rash. Apply this twice daily for 1-2 weeks.     HOME CARE:   Take cool showers and avoid direct sunlight.  Apply cool compress or wet dressings.  Take a bath in an oatmeal bath.  Sprinkle content of one Aveeno packet under running faucet with comfortably warm water.  Bathe for 15-20 minutes, 1-2 times daily.  Pat dry with a towel. Do not rub the rash.  Use hydrocortisone cream.  Take an antihistamine like Benadryl for widespread rashes that itch.  The adult dose of Benadryl is 25-50 mg by mouth 4 times daily.  Caution:  This type of medication may cause sleepiness.  Do not drink alcohol, drive, or operate dangerous machinery while taking antihistamines.  Do not take these medications if you have prostate enlargement.  Read package instructions thoroughly on all medications that you take.  GET HELP RIGHT AWAY IF:   Symptoms don't go away after treatment.  Severe itching that persists.  If you rash spreads or swells.  If you rash begins to smell.  If it blisters and opens or develops a yellow-brown crust.  You develop a fever.  You have a sore throat.  You become short of breath.  MAKE SURE YOU:  Understand these instructions. Will watch your condition. Will get help right away if you are not doing well or get worse.  Thank you for choosing an e-visit. Your e-visit answers were reviewed by a board certified advanced clinical practitioner to complete your personal care plan. Depending upon the condition, your plan could have included both over the counter or prescription medications. Please review your pharmacy choice. Be sure that the pharmacy you have chosen  is open so that you can pick up your prescription now.  If there is a problem you may message your provider in MyChart to have the prescription routed to another pharmacy. Your safety is important to [place]. If you have drug allergies check your prescription carefully.  For the next 24 hours, you can use MyChart to ask questions about today's visit, request a non-urgent call back, or ask for a work or school excuse from your e-visit provider. You will get an email in the next two days asking about your experience. I hope that your e-visit has been valuable and will speed your recovery.     Greater than 5 minutes, yet less than 10 minutes of time have been spent researching, coordinating and implementing care for this patient today.

## 2019-07-25 ENCOUNTER — Other Ambulatory Visit: Payer: Self-pay | Admitting: Obstetrics and Gynecology

## 2019-07-25 DIAGNOSIS — Z1231 Encounter for screening mammogram for malignant neoplasm of breast: Secondary | ICD-10-CM

## 2019-07-31 ENCOUNTER — Other Ambulatory Visit: Payer: Self-pay

## 2019-07-31 ENCOUNTER — Ambulatory Visit
Admission: RE | Admit: 2019-07-31 | Discharge: 2019-07-31 | Disposition: A | Discharge status: Home / Self Care | Payer: Managed Care, Other (non HMO) | Source: Ambulatory Visit | Attending: Obstetrics and Gynecology | Admitting: Obstetrics and Gynecology

## 2019-07-31 DIAGNOSIS — Z1231 Encounter for screening mammogram for malignant neoplasm of breast: Secondary | ICD-10-CM

## 2019-10-07 ENCOUNTER — Ambulatory Visit: Admit: 2019-10-07 | Discharge status: Against Medical Advice | Payer: Managed Care, Other (non HMO)

## 2019-12-21 ENCOUNTER — Ambulatory Visit
Admission: EM | Admit: 2019-12-21 | Discharge: 2019-12-21 | Disposition: A | Discharge status: Home / Self Care | Payer: Managed Care, Other (non HMO)

## 2019-12-21 ENCOUNTER — Other Ambulatory Visit: Payer: Self-pay

## 2019-12-21 DIAGNOSIS — H1032 Unspecified acute conjunctivitis, left eye: Secondary | ICD-10-CM

## 2019-12-21 DIAGNOSIS — L03211 Cellulitis of face: Secondary | ICD-10-CM

## 2019-12-21 MED ORDER — SULFAMETHOXAZOLE-TRIMETHOPRIM 800-160 MG PO TABS: 1.0000 | ORAL_TABLET | Freq: Two times a day (BID) | ORAL | 0 refills | Status: AC

## 2019-12-21 MED ORDER — POLYMYXIN B-TRIMETHOPRIM 10000-0.1 UNIT/ML-% OP SOLN: 1.0000 [drp] | Freq: Four times a day (QID) | OPHTHALMIC | 0 refills | Status: AC

## 2019-12-21 NOTE — Discharge Instructions (Signed)
You were seen for eye pain and discomfort and are being treated for an eye infection in the skin infection.   Take the antibiotics as prescribed until they're finished. If you think you're having a reaction, stop the medication, take benadryl and go to the nearest urgent care/emergency room. Take a probiotic while taking the antibiotic to decrease the chances of stomach upset.    Do not touch your eye unless you are putting of eyedrops.  When you use your eyedrops, they should you touch your eyes with clean hands.  Follow-up with your eye specialist as soon as possible.  Take care, Dr. Sharlet Salina, NP-c

## 2019-12-21 NOTE — ED Triage Notes (Signed)
Pt presents to Urgent Care with c/o eye irritation x 2 months. Pt reports she has "tiny white balls that stick to eyelid." States she saw optometrist approx 6 weeks ago and was referred to a specialist who she is scheduled to see in mid-December. Pt states optometrist prescribed an eye drop (cortisone/antiboitic combo), but she has not been able to get Rx filled d/t unavailability of medication. Both eyelids are reddened and lower L periorbital area is swollen and red.

## 2019-12-22 NOTE — ED Provider Notes (Signed)
[facility] [place] [facility] [place]   Name: Whitney Donovan DOB: 12-06-1965 MRN: [id number] CSN: [id number] PCP: Alan Mulder, MD  Arrival date and time:  12/21/19 1310  Chief Complaint:  Eye Problem   NOTE: Prior to seeing the patient today, I have reviewed the triage nursing documentation and vital signs. Clinical staff has updated patient's PMH/PSHx, current medication list, and drug allergies/intolerances to ensure comprehensive history available to assist in medical decision making.   History:   HPI: Whitney Donovan is a 54 y.o. female who presents today with complaints of right eye irritation.  Patient first noticed eye irritation approximately 2 months ago.  She was evaluated by an optometrist 6 weeks ago and received a referral to an eyelid specialist due to "tiny white balls on her eyelids", presumably milia.  She received a prescription for a cortisone/antibiotic combination eyedrop to hold her over until her specialist appointment, however she has been unable to get her prescription filled due to lack of availability.  Over the course of the last 6 weeks, her left eye became more irritated and she continued to scratch and pick on her eyelid.  That subsequently caused some swelling and erythema below her left eye, extending to her cheek.  She has also noticed some warmth to the area.  No drainage noted.  She denies any changes to her vision.   Past Medical History:  Diagnosis Date  . Anxiety   . Complication of anesthesia   . Diabetes mellitus without complication (HCC)   . Hypothyroidism   . PONV (postoperative nausea and vomiting)     Past Surgical History:  Procedure Laterality Date  . CARPAL TUNNEL RELEASE Right 10/15/2018   Procedure: CARPAL TUNNEL RELEASE;  Surgeon: Deeann Saint, MD;  Location: [facility] ORS;  Service: Orthopedics;  Laterality: Right;  . CESAREAN SECTION    . EYE SURGERY    . HYSTEROSCOPY WITH NOVASURE    . TONSILLECTOMY    . uterine ablation       Family History  Problem Relation Age of Onset  . Healthy Mother   . Pulmonary fibrosis Father   . Breast cancer Neg Hx     Social History   Tobacco Use  . Smoking status: Former Smoker    Packs/day: 1.00    Years: 15.00    Pack years: 15.00    Types: Cigarettes  . Smokeless tobacco: Never Used  Vaping Use  . Vaping Use: Never used  Substance Use Topics  . Alcohol use: Never  . Drug use: Never    There are no problems to display for this patient.   Home Medications:    Current Meds  Medication Sig  . citalopram (CELEXA) 10 MG tablet Take 10 mg by mouth daily.    Allergies:   Betadine [povidone iodine], Codeine, and Penicillins  Review of Systems (ROS): Review of Systems  Constitutional: Negative for fatigue and fever.  Eyes: Positive for pain and itching. Negative for photophobia, discharge and visual disturbance.  Skin: Positive for rash.  Neurological: Negative for dizziness and headaches.     Vital Signs: Today's Vitals   12/21/19 1327 12/21/19 1332 12/21/19 1413  BP:  123/81   Pulse:  90   Resp:  18   Temp:  99.2 F (37.3 C)   TempSrc:  Oral   SpO2:  100%   Weight: 167 lb (75.8 kg)    Height: 5' 4.5" (1.638 m)    PainSc: 5   5  Physical Exam: Physical Exam Vitals and nursing note reviewed.  Constitutional:      Appearance: Normal appearance.  Eyes:     General: Vision grossly intact.        Left eye: Discharge present.No foreign body or hordeolum.     Extraocular Movements: Extraocular movements intact.     Conjunctiva/sclera: Conjunctivae normal.     Comments: Possible milia note to bilateral eyelids   Cardiovascular:     Rate and Rhythm: Normal rate and regular rhythm.     Pulses: Normal pulses.     Heart sounds: Normal heart sounds.  Pulmonary:     Breath sounds: Normal breath sounds.  Skin:    General: Skin is warm and dry.       Neurological:     Mental Status: She is alert.  Psychiatric:        Mood and Affect: Mood  normal.        Behavior: Behavior normal.        Thought Content: Thought content normal.      Urgent Care Treatments / Results:   LABS: PLEASE NOTE: all labs that were ordered this encounter are listed, however only abnormal results are displayed. Labs Reviewed - No data to display  EKG: -None  RADIOLOGY: No results found.  PROCEDURES: Procedures  MEDICATIONS RECEIVED THIS VISIT: Medications - No data to display  PERTINENT CLINICAL COURSE NOTES/UPDATES:   Initial Impression / Assessment and Plan / Urgent Care Course:  Pertinent labs & imaging results that were available during my care of the patient were personally reviewed by me and considered in my medical decision making (see lab/imaging section of note for values and interpretations).  Whitney Donovan is a 54 y.o. female who presents to [facility] today with complaints of left eye pain and irritation, diagnosed with left bacterial conjunctivitis and facial cellulitis, and treated as such with the medications below. NP and patient reviewed discharge instructions below during visit.   Patient is well appearing overall in clinic today. She does not appear to be in any acute distress. Presenting symptoms (see HPI) and exam as documented above.   I have reviewed the follow up and strict return precautions for any new or worsening symptoms. Patient is aware of symptoms that would be deemed urgent/emergent, and would thus require further evaluation either here or in the emergency department. At the time of discharge, she verbalized understanding and consent with the discharge plan as it was reviewed with her. All questions were fielded by provider and/or clinic staff prior to patient discharge.    Final Clinical Impressions / Urgent Care Diagnoses:   Final diagnoses:  Acute bacterial conjunctivitis of left eye  Cellulitis of face    New Prescriptions:  [place] Controlled Substance Registry consulted? Not Applicable  Meds  ordered this encounter  Medications  . sulfamethoxazole-trimethoprim (BACTRIM DS) 800-160 MG tablet    Sig: Take 1 tablet by mouth 2 (two) times daily for 7 days.    Dispense:  14 tablet    Refill:  0  . trimethoprim-polymyxin b (POLYTRIM) ophthalmic solution    Sig: Place 1 drop into the left eye every 6 (six) hours for 7 days.    Dispense:  10 mL    Refill:  0      Discharge Instructions     You were seen for eye pain and discomfort and are being treated for an eye infection in the skin infection.   Take the antibiotics as prescribed  until they're finished. If you think you're having a reaction, stop the medication, take benadryl and go to the nearest urgent care/emergency room. Take a probiotic while taking the antibiotic to decrease the chances of stomach upset.    Do not touch your eye unless you are putting of eyedrops.  When you use your eyedrops, they should you touch your eyes with clean hands.  Follow-up with your eye specialist as soon as possible.  Take care, Dr. Sharlet Salina, NP-c     Recommended Follow up Care:  Patient encouraged to follow up with the following provider within the specified time frame, or sooner as dictated by the severity of her symptoms. As always, she was instructed that for any urgent/emergent care needs, she should seek care either here or in the emergency department for more immediate evaluation.   Bailey Mech, DNP, NP-c    Bailey Mech, NP 12/22/19 [phone]

## 2020-09-14 ENCOUNTER — Other Ambulatory Visit: Payer: Self-pay | Admitting: Obstetrics and Gynecology

## 2020-09-14 DIAGNOSIS — Z1231 Encounter for screening mammogram for malignant neoplasm of breast: Secondary | ICD-10-CM

## 2020-10-28 ENCOUNTER — Ambulatory Visit: Payer: Managed Care, Other (non HMO)

## 2020-11-26 ENCOUNTER — Ambulatory Visit
Admission: RE | Admit: 2020-11-26 | Discharge: 2020-11-26 | Disposition: A | Discharge status: Home / Self Care | Payer: Managed Care, Other (non HMO) | Source: Ambulatory Visit | Attending: Obstetrics and Gynecology | Admitting: Obstetrics and Gynecology

## 2020-11-26 ENCOUNTER — Other Ambulatory Visit: Payer: Self-pay

## 2020-11-26 DIAGNOSIS — Z1231 Encounter for screening mammogram for malignant neoplasm of breast: Secondary | ICD-10-CM

## 2021-03-29 LAB — COLOGUARD
COLOGUARD: NEGATIVE
COLOGUARD: NEGATIVE

## 2021-03-29 LAB — EXTERNAL GENERIC LAB PROCEDURE: COLOGUARD: NEGATIVE

## 2021-06-15 ENCOUNTER — Ambulatory Visit (INDEPENDENT_AMBULATORY_CARE_PROVIDER_SITE_OTHER): Payer: BC Managed Care – PPO | Admitting: Podiatry

## 2021-06-15 DIAGNOSIS — B351 Tinea unguium: Secondary | ICD-10-CM | POA: Diagnosis not present

## 2021-06-15 DIAGNOSIS — Z79899 Other long term (current) drug therapy: Secondary | ICD-10-CM | POA: Diagnosis not present

## 2021-06-15 NOTE — Progress Notes (Signed)
Subjective:  Patient ID: Whitney Donovan, female    DOB: [birth year],  MRN: [id number]  Chief Complaint  Patient presents with   Nail Problem    56 y.o. female presents with the above complaint.  Patient presents with complaint left hallux thickened elongated dystrophic toenails x1.  She states that has been present for quite some time is progressive gotten worse.  She wanted get it evaluated.  She has not tried any treatment options for nail fungus.  She has not seen anyone else prior to seeing me.  It does not cause her pain.   Review of Systems: Negative except as noted in the HPI. Denies N/V/F/Ch.  Past Medical History:  Diagnosis Date   Anxiety    Complication of anesthesia    Diabetes mellitus without complication (HCC)    Hypothyroidism    PONV (postoperative nausea and vomiting)     Current Outpatient Medications:    ALPRAZolam (XANAX) 0.5 MG tablet, Take 0.5 mg by mouth at bedtime as needed for anxiety., Disp: , Rfl:    citalopram (CELEXA) 10 MG tablet, Take 10 mg by mouth daily., Disp: , Rfl:    ergocalciferol (VITAMIN D2) 1.25 MG (50000 UT) capsule, Take 50,000 Units by mouth once a week., Disp: , Rfl:    fluorouracil (EFUDEX) 5 % cream, Apply topically 2 (two) times daily., Disp: , Rfl:    furosemide (LASIX) 40 MG tablet, Take 40 mg by mouth., Disp: , Rfl:    gabapentin (NEURONTIN) 400 MG capsule, Take 1 capsule (400 mg total) by mouth 3 (three) times daily., Disp: 60 capsule, Rfl: 3   HYDROcodone-acetaminophen (NORCO) 5-325 MG tablet, Take 1-2 tablets by mouth every 6 (six) hours as needed., Disp: 50 tablet, Rfl: 0   meloxicam (MOBIC) 15 MG tablet, Take 1 tablet (15 mg total) by mouth daily., Disp: 30 tablet, Rfl: 3   metFORMIN (GLUCOPHAGE) 500 MG tablet, Take 1,000 mg by mouth 2 (two) times daily with a meal., Disp: , Rfl:    thyroid (ARMOUR) 120 MG tablet, Take 120 mg by mouth daily before breakfast., Disp: , Rfl:    triamcinolone (KENALOG) 0.025 % ointment, Apply 1  application topically 2 (two) times daily., Disp: 30 g, Rfl: 0  Social History   Tobacco Use  Smoking Status Former   Packs/day: 1.00   Years: 15.00   Pack years: 15.00   Types: Cigarettes  Smokeless Tobacco Never    Allergies  Allergen Reactions   Betadine [Povidone Iodine]     Causes blistering    Codeine Nausea Only   Penicillins Rash   Objective:  There were no vitals filed for this visit. There is no height or weight on file to calculate BMI. Constitutional Well developed. Well nourished.  Vascular Dorsalis pedis pulses palpable bilaterally. Posterior tibial pulses palpable bilaterally. Capillary refill normal to all digits.  No cyanosis or clubbing noted. Pedal hair growth normal.  Neurologic Normal speech. Oriented to person, place, and time. Epicritic sensation to light touch grossly present bilaterally.  Dermatologic Nails thickened elongated dystrophic toenails x1 left hallux.  Mycotic nature to it.  Mild pain on palpation Skin within normal limits  Orthopedic: Normal joint ROM without pain or crepitus bilaterally. No visible deformities. No bony tenderness.   Radiographs: None Assessment:   1. Long-term use of high-risk medication   2. Nail fungus   3. Onychomycosis due to dermatophyte    Plan:  Patient was evaluated and treated and all questions answered.  Left hallux onychomycosis -  Educated the patient on the etiology of onychomycosis and various treatment options associated with improving the fungal load.  I explained to the patient that there is 3 treatment options available to treat the onychomycosis including topical, p.o., laser treatment.  Patient elected to undergo p.o. options with Lamisil/terbinafine therapy.  In order for me to start the medication therapy, I explained to the patient the importance of evaluating the liver and obtaining the liver function test.  Once the liver function test comes back normal I will start him on 8-month course of  Lamisil therapy.  Patient understood all risk and would like to proceed with Lamisil therapy.  I have asked the patient to immediately stop the Lamisil therapy if she has any reactions to it and call the office or go to the emergency room right away.  Patient states understanding   No follow-ups on file.

## 2021-06-16 LAB — HEPATIC FUNCTION PANEL
ALT: 51 IU/L — ABNORMAL HIGH (ref 0–32)
AST: 23 IU/L (ref 0–40)
Albumin: 4.7 g/dL (ref 3.8–4.9)
Alkaline Phosphatase: 70 IU/L (ref 44–121)
Bilirubin Total: 0.3 mg/dL (ref 0.0–1.2)
Bilirubin, Direct: 0.1 mg/dL (ref 0.00–0.40)
Total Protein: 7.4 g/dL (ref 6.0–8.5)

## 2021-10-19 ENCOUNTER — Ambulatory Visit: Payer: BC Managed Care – PPO | Admitting: Podiatry

## 2021-10-27 ENCOUNTER — Other Ambulatory Visit: Payer: Self-pay | Admitting: Obstetrics and Gynecology

## 2021-10-27 DIAGNOSIS — Z1231 Encounter for screening mammogram for malignant neoplasm of breast: Secondary | ICD-10-CM

## 2021-12-02 ENCOUNTER — Ambulatory Visit: Payer: Self-pay

## 2022-01-28 ENCOUNTER — Ambulatory Visit
Admission: RE | Admit: 2022-01-28 | Discharge: 2022-01-28 | Disposition: A | Discharge status: Home / Self Care | Payer: BC Managed Care – PPO | Source: Ambulatory Visit | Attending: Obstetrics and Gynecology | Admitting: Obstetrics and Gynecology

## 2022-01-28 DIAGNOSIS — Z1231 Encounter for screening mammogram for malignant neoplasm of breast: Secondary | ICD-10-CM

## 2022-04-25 ENCOUNTER — Encounter: Payer: Self-pay | Admitting: Internal Medicine

## 2022-05-03 ENCOUNTER — Ambulatory Visit (INDEPENDENT_AMBULATORY_CARE_PROVIDER_SITE_OTHER): Payer: BC Managed Care – PPO | Admitting: Internal Medicine

## 2022-05-03 ENCOUNTER — Encounter: Payer: Self-pay | Admitting: Internal Medicine

## 2022-05-03 VITALS — BP 128/56 | HR 96 | Ht 64.5 in | Wt 151.4 lb

## 2022-05-03 DIAGNOSIS — R1013 Epigastric pain: Secondary | ICD-10-CM | POA: Diagnosis not present

## 2022-05-03 NOTE — Progress Notes (Signed)
Patient ID: Whitney Donovan, female   DOB: 1965-06-20, 57 y.o.   MRN: [id number] HPI: Whitney Donovan is a 57 year old female with a past medical history of hypothyroidism, diabetes and depression who is seen in consult at the request of Dr. Patrecia Pace to evaluate epigastric pain.  She is here alone today.  She reports that in middle December she developed episodic epigastric abdominal pain.  Initial episode starting after eating Whitney Donovan and began about 30 minutes after a meal.  Became intense and the episode lasted about 1/2-hour.  Seem to relieve itself with Pepto-Bismol.  Since this time she has had 3 additional episodes the last was last week.  Each episode has become more intense and lasting a bit longer each time.  Can be moderate to severe.  It is crescendo decrescendo.  Radiates through to the back.  On 1 episode in March it was associated with nausea and vomiting x 1.  She uses Pepto-Bismol each time but she is honestly not sure that this helps at all.  It does make her slightly constipated.  Pain described as a sharp knifelike pain.  She also started herself on famotidine which she takes once daily for the last 2 months but cannot tell if its helped or not.  She started [facility] for diabetes and elevated A1c in August 2023.  She worked up from 2.5 mg to 12.5 mg with each prescription.  She lost 40 pounds.  She stopped this in mid March because she was not sure if it was contributing to the painful episodes described above.  Her doctor also said with her weight loss and A1c having improved to 5.6 the medication was no longer necessary.  She has had Cologuard negative x 2 with the last negative test February 2023  Former smoker none in many years.  No alcohol.  No family history of GI tract malignancy.  2 prior C-sections, and endometrial ablation in 2009 and an orbital decompression surgery in 2018.  She works for [employer].  She does travel with work.  Past Medical History:  Diagnosis Date    Anxiety    Complication of anesthesia    Depression    Diabetes mellitus without complication    Hypothyroidism    PONV (postoperative nausea and vomiting)     Past Surgical History:  Procedure Laterality Date   CARPAL TUNNEL RELEASE Right 10/15/2018   Procedure: CARPAL TUNNEL RELEASE;  Surgeon: Deeann Saint, MD;  Location: [facility] ORS;  Service: Orthopedics;  Laterality: Right;   CESAREAN SECTION     EYE SURGERY     HYSTEROSCOPY WITH NOVASURE     TONSILLECTOMY     uterine ablation      Outpatient Medications Prior to Visit  Medication Sig Dispense Refill   ALPRAZolam (XANAX) 0.5 MG tablet Take 0.5 mg by mouth at bedtime as needed for anxiety.     ergocalciferol (VITAMIN D2) 1.25 MG (50000 UT) capsule Take 50,000 Units by mouth once a week.     famotidine (PEPCID) 20 MG tablet Take 20 mg by mouth daily. Zantac OTC     furosemide (LASIX) 40 MG tablet Take 40 mg by mouth.     metFORMIN (GLUCOPHAGE) 500 MG tablet Take 1,000 mg by mouth 2 (two) times daily with a meal.     NP THYROID 90 MG tablet Take 90 mg by mouth every other day.     thyroid (ARMOUR) 120 MG tablet Take 120 mg by mouth every other day.  ranitidine (ZANTAC) 75 MG tablet Take 75 mg by mouth daily.     MOUNJARO 12.5 MG/0.5ML Pen Inject 12.5 mg into the skin once a week. (Patient not taking: Reported on 05/03/2022)     citalopram (CELEXA) 10 MG tablet Take 10 mg by mouth daily.     fluorouracil (EFUDEX) 5 % cream Apply topically 2 (two) times daily.     gabapentin (NEURONTIN) 400 MG capsule Take 1 capsule (400 mg total) by mouth 3 (three) times daily. 60 capsule 3   HYDROcodone-acetaminophen (NORCO) 5-325 MG tablet Take 1-2 tablets by mouth every 6 (six) hours as needed. 50 tablet 0   meloxicam (MOBIC) 15 MG tablet Take 1 tablet (15 mg total) by mouth daily. 30 tablet 3   triamcinolone (KENALOG) 0.025 % ointment Apply 1 application topically 2 (two) times daily. 30 g 0   No facility-administered medications prior to  visit.    Allergies  Allergen Reactions   Betadine [Povidone Iodine]     Causes blistering    Codeine Nausea Only   Penicillins Rash    Family History  Problem Relation Age of Onset   Healthy Mother    Dementia Mother    Pulmonary fibrosis Father    Breast cancer Neg Hx    Colon cancer Neg Hx    Esophageal cancer Neg Hx     Social History   Tobacco Use   Smoking status: Former    Packs/day: 1.00    Years: 15.00    Additional pack years: 0.00    Total pack years: 15.00    Types: Cigarettes   Smokeless tobacco: Never  Vaping Use   Vaping Use: Never used  Substance Use Topics   Alcohol use: Never   Drug use: Never    ROS: As per history of present illness, otherwise negative  BP (!) 128/56   Pulse 96   Ht 5' 4.5" (1.638 m)   Wt 151 lb 6.4 oz (68.7 kg)   SpO2 97%   BMI 25.59 kg/m  Gen: awake, alert, NAD HEENT: anicteric  CV: RRR, no mrg Pulm: CTA b/l Abd: soft, NT/ND, +BS throughout Ext: no c/c/e Neuro: nonfocal   RELEVANT LABS AND IMAGING:  CMP     Component Value Date/Time   PROT 7.4 06/15/2021 1505   ALBUMIN 4.7 06/15/2021 1505   AST 23 06/15/2021 1505   ALT 51 (H) 06/15/2021 1505   ALKPHOS 70 06/15/2021 1505   BILITOT 0.3 06/15/2021 1505    ASSESSMENT/PLAN: 57 year old female with a past medical history of hypothyroidism, diabetes and depression who is seen in consult at the request of Dr. Patrecia Pace to evaluate epigastric pain.   Epigastric pain --highly suspicious for biliary pain.  I am suspicious for gallstones possibly even exacerbated by fairly significant weight loss of 40 pounds since last summer in the setting of Mounjaro.  Mounjaro now on hold for nearly 3 weeks. -- Abdominal ultrasound -- If negative then HIDA scan with CCK -- If negative EGD -- She can continue famotidine 20 mg daily for now though this would likely not be expected to help gallstone pain.  I did offer other pain medications but for now she declines.  2.  CRC  screening --negative Cologuard x 2 last February 2023.  Cologuard recommended every 3 years versus optical colonoscopy in February 2026.    GY:[id number], Delsa Sale, Md [address] [place],  [place] [address]

## 2022-05-03 NOTE — Patient Instructions (Signed)
You have been scheduled for an abdominal ultrasound at [facility] (1st floor of hospital) on 05/09/2022 at 10:00am. Please arrive 15 minutes prior to your appointment for registration. Make certain not to have anything to eat or drink 6 hours prior to your appointment. Should you need to reschedule your appointment, please contact radiology at [phone]. This test typically takes about 30 minutes to perform.   _______________________________________________________  If your blood pressure at your visit was 140/90 or greater, please contact your primary care physician to follow up on this.  _______________________________________________________  If you are age 16 or older, your body mass index should be between 23-30. Your Body mass index is 25.59 kg/m. If this is out of the aforementioned range listed, please consider follow up with your Primary Care Provider.  If you are age 13 or younger, your body mass index should be between 19-25. Your Body mass index is 25.59 kg/m. If this is out of the aformentioned range listed, please consider follow up with your Primary Care Provider.   ________________________________________________________  The [facility] providers would like to encourage you to use [facility] to communicate with providers for non-urgent requests or questions.  Due to long hold times on the telephone, sending your provider a message by [facility] may be a faster and more efficient way to get a response.  Please allow 48 business hours for a response.  Please remember that this is for non-urgent requests.  _______________________________________________________ It was a pleasure to see you today!  Thank you for trusting me with your gastrointestinal care!

## 2022-05-09 ENCOUNTER — Ambulatory Visit (HOSPITAL_COMMUNITY)
Admission: RE | Admit: 2022-05-09 | Discharge: 2022-05-09 | Disposition: A | Discharge status: Home / Self Care | Payer: BC Managed Care – PPO | Source: Ambulatory Visit | Attending: Internal Medicine | Admitting: Internal Medicine

## 2022-05-09 DIAGNOSIS — R1013 Epigastric pain: Secondary | ICD-10-CM | POA: Diagnosis present

## 2022-05-23 ENCOUNTER — Other Ambulatory Visit: Payer: Self-pay | Admitting: General Surgery

## 2022-05-23 DIAGNOSIS — Z9889 Other specified postprocedural states: Secondary | ICD-10-CM

## 2022-05-23 DIAGNOSIS — K8020 Calculus of gallbladder without cholecystitis without obstruction: Secondary | ICD-10-CM

## 2022-06-03 ENCOUNTER — Observation Stay (HOSPITAL_COMMUNITY)
Admission: EM | Admit: 2022-06-03 | Discharge: 2022-06-05 | Disposition: A | Discharge status: Home / Self Care | Payer: BC Managed Care – PPO | Attending: General Surgery | Admitting: General Surgery

## 2022-06-03 ENCOUNTER — Other Ambulatory Visit: Payer: Self-pay

## 2022-06-03 DIAGNOSIS — K8020 Calculus of gallbladder without cholecystitis without obstruction: Secondary | ICD-10-CM | POA: Diagnosis present

## 2022-06-03 DIAGNOSIS — K8010 Calculus of gallbladder with chronic cholecystitis without obstruction: Secondary | ICD-10-CM | POA: Diagnosis not present

## 2022-06-03 DIAGNOSIS — Z79899 Other long term (current) drug therapy: Secondary | ICD-10-CM | POA: Insufficient documentation

## 2022-06-03 DIAGNOSIS — Z87891 Personal history of nicotine dependence: Secondary | ICD-10-CM | POA: Diagnosis not present

## 2022-06-03 DIAGNOSIS — E039 Hypothyroidism, unspecified: Secondary | ICD-10-CM | POA: Insufficient documentation

## 2022-06-03 DIAGNOSIS — E119 Type 2 diabetes mellitus without complications: Secondary | ICD-10-CM | POA: Diagnosis not present

## 2022-06-03 DIAGNOSIS — Z7984 Long term (current) use of oral hypoglycemic drugs: Secondary | ICD-10-CM | POA: Insufficient documentation

## 2022-06-03 DIAGNOSIS — R1011 Right upper quadrant pain: Secondary | ICD-10-CM | POA: Diagnosis present

## 2022-06-03 DIAGNOSIS — R112 Nausea with vomiting, unspecified: Secondary | ICD-10-CM

## 2022-06-03 LAB — COMPREHENSIVE METABOLIC PANEL
ALT: 18 U/L (ref 0–44)
AST: 16 U/L (ref 15–41)
Albumin: 4.3 g/dL (ref 3.5–5.0)
Alkaline Phosphatase: 56 U/L (ref 38–126)
Anion gap: 13 (ref 5–15)
BUN: 11 mg/dL (ref 6–20)
CO2: 18 mmol/L — ABNORMAL LOW (ref 22–32)
Calcium: 9 mg/dL (ref 8.9–10.3)
Chloride: 107 mmol/L (ref 98–111)
Creatinine, Ser: 0.71 mg/dL (ref 0.44–1.00)
GFR, Estimated: >60 mL/min (ref >60)
Glucose, Bld: 91 mg/dL (ref 70–99)
Potassium: 3.5 mmol/L (ref 3.5–5.1)
Sodium: 138 mmol/L (ref 135–145)
Total Bilirubin: 0.4 mg/dL (ref 0.3–1.2)
Total Protein: 7.5 g/dL (ref 6.5–8.1)

## 2022-06-03 LAB — URINALYSIS, ROUTINE W REFLEX MICROSCOPIC
Bilirubin Urine: NEGATIVE
Glucose, UA: NEGATIVE mg/dL
Hgb urine dipstick: NEGATIVE
Ketones, ur: NEGATIVE mg/dL
Nitrite: POSITIVE — AB
Protein, ur: NEGATIVE mg/dL
Specific Gravity, Urine: 1.021 (ref 1.005–1.030)
pH: 5 (ref 5.0–8.0)

## 2022-06-03 LAB — CBC WITH DIFFERENTIAL/PLATELET
Abs Immature Granulocytes: 0.04 10*3/uL (ref 0.00–0.07)
Basophils Absolute: 0.1 10*3/uL (ref 0.0–0.1)
Basophils Relative: 0 %
Eosinophils Absolute: 0.1 10*3/uL (ref 0.0–0.5)
Eosinophils Relative: 1 %
HCT: 42.4 % (ref 36.0–46.0)
Hemoglobin: 14.3 g/dL (ref 12.0–15.0)
Immature Granulocytes: 0 %
Lymphocytes Relative: 15 %
Lymphs Abs: 1.8 10*3/uL (ref 0.7–4.0)
MCH: 31.8 pg (ref 26.0–34.0)
MCHC: 33.7 g/dL (ref 30.0–36.0)
MCV: 94.4 fL (ref 80.0–100.0)
Monocytes Absolute: 0.7 10*3/uL (ref 0.1–1.0)
Monocytes Relative: 6 %
Neutro Abs: 9.6 10*3/uL — ABNORMAL HIGH (ref 1.7–7.7)
Neutrophils Relative %: 78 %
Platelets: 283 10*3/uL (ref 150–400)
RBC: 4.49 MIL/uL (ref 3.87–5.11)
RDW: 12 % (ref 11.5–15.5)
WBC: 12.2 10*3/uL — ABNORMAL HIGH (ref 4.0–10.5)
nRBC: 0 % (ref 0.0–0.2)

## 2022-06-03 LAB — LIPASE, BLOOD: Lipase: 60 U/L — ABNORMAL HIGH (ref 11–51)

## 2022-06-03 MED ORDER — PANTOPRAZOLE SODIUM 40 MG IV SOLR
40.0000 mg | Freq: Every day | INTRAVENOUS | Status: DC
  Administered 2022-06-04 (×2): 40 mg via INTRAVENOUS
  Filled 2022-06-03 (×2): qty 10

## 2022-06-03 MED ORDER — INSULIN ASPART 100 UNIT/ML IJ SOLN
0.0000 [IU] | Freq: Three times a day (TID) | INTRAMUSCULAR | Status: DC
  Administered 2022-06-04: 1 [IU] via SUBCUTANEOUS
  Filled 2022-06-03: qty 0.09

## 2022-06-03 MED ORDER — THYROID 60 MG PO TABS
90.0000 mg | ORAL_TABLET | ORAL | Status: DC
  Administered 2022-06-05: 90 mg via ORAL
  Filled 2022-06-03: qty 1

## 2022-06-03 MED ORDER — FENTANYL CITRATE PF 50 MCG/ML IJ SOSY
12.5000 ug | PREFILLED_SYRINGE | INTRAMUSCULAR | Status: DC | PRN
  Administered 2022-06-04 (×6): 12.5 ug via INTRAVENOUS
  Filled 2022-06-03 (×6): qty 1

## 2022-06-03 MED ORDER — HYDROMORPHONE HCL 1 MG/ML IJ SOLN
0.5000 mg | Freq: Once | INTRAMUSCULAR | Status: AC
  Administered 2022-06-03: 0.5 mg via INTRAVENOUS
  Filled 2022-06-03: qty 1

## 2022-06-03 MED ORDER — ONDANSETRON 4 MG PO TBDP: 4.0000 mg | ORAL_TABLET | Freq: Four times a day (QID) | ORAL | Status: DC | PRN

## 2022-06-03 MED ORDER — ONDANSETRON HCL 4 MG/2ML IJ SOLN
4.0000 mg | Freq: Once | INTRAMUSCULAR | Status: AC
  Administered 2022-06-03: 4 mg via INTRAVENOUS
  Filled 2022-06-03: qty 2

## 2022-06-03 MED ORDER — THYROID 60 MG PO TABS
120.0000 mg | ORAL_TABLET | ORAL | Status: DC
  Administered 2022-06-04: 120 mg via ORAL
  Filled 2022-06-03 (×2): qty 2

## 2022-06-03 MED ORDER — LACTATED RINGERS IV SOLN
INTRAVENOUS | Status: DC
Start: 2022-06-03 — End: 2022-06-04

## 2022-06-03 MED ORDER — ONDANSETRON HCL 4 MG/2ML IJ SOLN
4.0000 mg | Freq: Four times a day (QID) | INTRAMUSCULAR | Status: DC | PRN
  Administered 2022-06-04: 4 mg via INTRAVENOUS
  Filled 2022-06-03: qty 2

## 2022-06-03 MED ORDER — LACTATED RINGERS IV BOLUS
1000.0000 mL | Freq: Once | INTRAVENOUS | Status: AC
  Administered 2022-06-03: 1000 mL via INTRAVENOUS

## 2022-06-03 NOTE — ED Provider Notes (Signed)
[place] EMERGENCY DEPARTMENT AT [facility] Provider Note   CSN: [id number] Arrival date & time: 06/03/22  2015     History  Chief Complaint  Patient presents with   Abdominal Pain    Whitney Donovan is a 57 y.o. female.  57 year old patient who presents with right upper quadrant pain.  Patient has no history of gallbladder disease and is scheduled to have surgery in approximately 10 days.  States that she had a flight back [place] and symptoms became worse last night.  Has had nausea no vomiting.  No fever or chills.  Called the general surgeon on-call who requested that she follow-up with him in the office.  Due to worsening pain patient presents at this time.       Home Medications Prior to Admission medications   Medication Sig Start Date End Date Taking? Authorizing Provider  alendronate (FOSAMAX) 70 MG tablet Take 70 mg by mouth once a week.    [provider]  ALPRAZolam Prudy Feeler) 0.5 MG tablet Take 0.5 mg by mouth at bedtime.    [provider]  clobetasol (TEMOVATE) 0.05 % external solution Apply 1 Application topically once a week.    [provider]  clobetasol cream (TEMOVATE) 0.05 % Apply 1 Application topically once a week.    [provider]  Clobetasol Propionate 0.05 % shampoo Apply 1 Application topically every 7 (seven) days.    [provider]  ergocalciferol (VITAMIN D2) 1.25 MG (50000 UT) capsule Take 50,000 Units by mouth once a week.    [provider]  furosemide (LASIX) 40 MG tablet Take 40 mg by mouth daily.    [provider]  metFORMIN (GLUCOPHAGE) 500 MG tablet Take 1,000 mg by mouth 2 (two) times daily with a meal.    [provider]  NP THYROID 90 MG tablet Take 90 mg by mouth every other day.    [provider]  thyroid (ARMOUR) 120 MG tablet Take 120 mg by mouth every other day.    [provider]      Allergies    Betadine [povidone iodine],  Codeine, and Penicillins    Review of Systems   Review of Systems  All other systems reviewed and are negative.   Physical Exam Updated Vital Signs BP (!) 146/71 (BP Location: Right Arm)   Pulse 80   Temp 97.7 F (36.5 C) (Oral)   Resp 18   Ht 1.651 m (5\' 5" )   Wt 63.5 kg   SpO2 100%   BMI 23.30 kg/m  Physical Exam Vitals and nursing note reviewed.  Constitutional:      General: She is not in acute distress.    Appearance: Normal appearance. She is well-developed. She is not toxic-appearing.  HENT:     Head: Normocephalic and atraumatic.  Eyes:     General: Lids are normal.     Conjunctiva/sclera: Conjunctivae normal.     Pupils: Pupils are equal, round, and reactive to light.  Neck:     Thyroid: No thyroid mass.     Trachea: No tracheal deviation.  Cardiovascular:     Rate and Rhythm: Normal rate and regular rhythm.     Heart sounds: Normal heart sounds. No murmur heard.    No gallop.  Pulmonary:     Effort: Pulmonary effort is normal. No respiratory distress.     Breath sounds: Normal breath sounds. No stridor. No decreased breath sounds, wheezing, rhonchi or rales.  Abdominal:  General: There is no distension.     Palpations: Abdomen is soft.     Tenderness: There is abdominal tenderness in the right upper quadrant. There is guarding. There is no rebound.    Musculoskeletal:        General: No tenderness. Normal range of motion.     Cervical back: Normal range of motion and neck supple.  Skin:    General: Skin is warm and dry.     Findings: No abrasion or rash.  Neurological:     Mental Status: She is alert and oriented to person, place, and time. Mental status is at baseline.     GCS: GCS eye subscore is 4. GCS verbal subscore is 5. GCS motor subscore is 6.     Cranial Nerves: No cranial nerve deficit.     Sensory: No sensory deficit.     Motor: Motor function is intact.  Psychiatric:        Attention and Perception: Attention normal.        Speech:  Speech normal.        Behavior: Behavior normal.     ED Results / Procedures / Treatments   Labs (all labs ordered are listed, but only abnormal results are displayed) Labs Reviewed  COMPREHENSIVE METABOLIC PANEL  LIPASE, BLOOD  CBC WITH DIFFERENTIAL/PLATELET  URINALYSIS, ROUTINE W REFLEX MICROSCOPIC    EKG None  Radiology No results found.  Procedures Procedures    Medications Ordered in ED Medications  lactated ringers bolus 1,000 mL (has no administration in time range)  lactated ringers infusion (has no administration in time range)  HYDROmorphone (DILAUDID) injection 0.5 mg (has no administration in time range)  ondansetron (ZOFRAN) injection 4 mg (has no administration in time range)    ED Course/ Medical Decision Making/ A&P                             Medical Decision Making Amount and/or Complexity of Data Reviewed Labs: ordered.  Risk Prescription drug management.   Patient be medicated for pain here and given IV fluids.  Discussed case with surgeon on-call who will come and see the patient admit her for a likely cholecystectomy.        Final Clinical Impression(s) / ED Diagnoses Final diagnoses:  None    Rx / DC Orders ED Discharge Orders     None         Lorre Nick, MD 06/03/22 2141

## 2022-06-03 NOTE — H&P (Signed)
Whitney Donovan is an 57 y.o. female.   Chief Complaint: abd pain HPI: The patient is a 57 year old white female who has been having intermittent right upper quadrant pain since December.  She has seen Dr. Donell Beers and is scheduled for laparoscopic cholecystectomy on May 21.  Earlier today her right upper quadrant pain recurred.  She describes it as severe.  She denies any nausea or vomiting.  She did have an ultrasound that shows stones in the gallbladder but no gallbladder wall thickening or ductal dilatation.  Her liver functions are normal.  Past Medical History:  Diagnosis Date   Anxiety    Complication of anesthesia    Depression    Diabetes mellitus without complication (HCC)    Hypothyroidism    PONV (postoperative nausea and vomiting)     Past Surgical History:  Procedure Laterality Date   CARPAL TUNNEL RELEASE Right 10/15/2018   Procedure: CARPAL TUNNEL RELEASE;  Surgeon: Deeann Saint, MD;  Location: [facility] ORS;  Service: Orthopedics;  Laterality: Right;   CESAREAN SECTION     EYE SURGERY     HYSTEROSCOPY WITH NOVASURE     TONSILLECTOMY     uterine ablation      Family History  Problem Relation Age of Onset   Healthy Mother    Dementia Mother    Pulmonary fibrosis Father    Breast cancer Neg Hx    Colon cancer Neg Hx    Esophageal cancer Neg Hx    Social History:  reports that she has quit smoking. Her smoking use included cigarettes. She has a 15.00 pack-year smoking history. She has never used smokeless tobacco. She reports that she does not drink alcohol and does not use drugs.  Allergies:  Allergies  Allergen Reactions   Betadine [Povidone Iodine]     Causes blistering    Codeine Nausea Only   Penicillins Rash    (Not in a hospital admission)   Results for orders placed or performed during the hospital encounter of 06/03/22 (from the past 48 hour(s))  Comprehensive metabolic panel     Status: Abnormal   Collection Time: 06/03/22  8:52 PM  Result Value Ref  Range   Sodium 138 135 - 145 mmol/L   Potassium 3.5 3.5 - 5.1 mmol/L   Chloride 107 98 - 111 mmol/L   CO2 18 (L) 22 - 32 mmol/L   Glucose, Bld 91 70 - 99 mg/dL    Comment: Glucose reference range applies only to samples taken after fasting for at least 8 hours.   BUN 11 6 - 20 mg/dL   Creatinine, Ser 9.62 0.44 - 1.00 mg/dL   Calcium 9.0 8.9 - 95.2 mg/dL   Total Protein 7.5 6.5 - 8.1 g/dL   Albumin 4.3 3.5 - 5.0 g/dL   AST 16 15 - 41 U/L   ALT 18 0 - 44 U/L   Alkaline Phosphatase 56 38 - 126 U/L   Total Bilirubin 0.4 0.3 - 1.2 mg/dL   GFR, Estimated >84 >13 mL/min    Comment: (NOTE) Calculated using the CKD-EPI Creatinine Equation (2021)    Anion gap 13 5 - 15    Comment: Performed at [facility], [address], [place], [place] [address]  Lipase, blood     Status: Abnormal   Collection Time: 06/03/22  8:52 PM  Result Value Ref Range   Lipase 60 (H) 11 - 51 U/L    Comment: Performed at [facility], [address] [place], [place] [address]  CBC with Diff     Status: Abnormal   Collection Time: 06/03/22  8:52 PM  Result Value Ref Range   WBC 12.2 (H) 4.0 - 10.5 K/uL   RBC 4.49 3.87 - 5.11 MIL/uL   Hemoglobin 14.3 12.0 - 15.0 g/dL   HCT 81.1 91.4 - 78.2 %   MCV 94.4 80.0 - 100.0 fL   MCH 31.8 26.0 - 34.0 pg   MCHC 33.7 30.0 - 36.0 g/dL   RDW 95.6 21.3 - 08.6 %   Platelets 283 150 - 400 K/uL   nRBC 0.0 0.0 - 0.2 %   Neutrophils Relative % 78 %   Neutro Abs 9.6 (H) 1.7 - 7.7 K/uL   Lymphocytes Relative 15 %   Lymphs Abs 1.8 0.7 - 4.0 K/uL   Monocytes Relative 6 %   Monocytes Absolute 0.7 0.1 - 1.0 K/uL   Eosinophils Relative 1 %   Eosinophils Absolute 0.1 0.0 - 0.5 K/uL   Basophils Relative 0 %   Basophils Absolute 0.1 0.0 - 0.1 K/uL   Immature Granulocytes 0 %   Abs Immature Granulocytes 0.04 0.00 - 0.07 K/uL    Comment: Performed at [facility], [address], [place], [place] [address]  Urinalysis,  Routine w reflex microscopic -Urine, Clean Catch     Status: Abnormal   Collection Time: 06/03/22  8:52 PM  Result Value Ref Range   Color, Urine YELLOW YELLOW   APPearance HAZY (A) CLEAR   Specific Gravity, Urine 1.021 1.005 - 1.030   pH 5.0 5.0 - 8.0   Glucose, UA NEGATIVE NEGATIVE mg/dL   Hgb urine dipstick NEGATIVE NEGATIVE   Bilirubin Urine NEGATIVE NEGATIVE   Ketones, ur NEGATIVE NEGATIVE mg/dL   Protein, ur NEGATIVE NEGATIVE mg/dL   Nitrite POSITIVE (A) NEGATIVE   Leukocytes,Ua MODERATE (A) NEGATIVE   RBC / HPF 0-5 0 - 5 RBC/hpf   WBC, UA 21-50 0 - 5 WBC/hpf   Bacteria, UA MANY (A) NONE SEEN   Squamous Epithelial / HPF 0-5 0 - 5 /HPF   Mucus PRESENT    Ca Oxalate Crys, UA PRESENT     Comment: Performed at [facility], [address], [place], [place] [address]   No results found.  Review of Systems  Constitutional: Negative.   HENT: Negative.    Eyes: Negative.   Respiratory: Negative.    Cardiovascular: Negative.   Gastrointestinal:  Positive for abdominal pain. Negative for nausea and vomiting.  Endocrine: Negative.   Genitourinary: Negative.   Musculoskeletal: Negative.   Skin: Negative.   Allergic/Immunologic: Negative.   Neurological: Negative.   Hematological: Negative.   Psychiatric/Behavioral: Negative.      Blood pressure 123/66, pulse 77, temperature 97.7 F (36.5 C), temperature source Oral, resp. rate 18, height 5\' 5"  (1.651 m), weight 63.5 kg, SpO2 97 %. Physical Exam Vitals reviewed.  Constitutional:      General: She is not in acute distress.    Appearance: Normal appearance.  HENT:     Head: Normocephalic and atraumatic.     Right Ear: External ear normal.     Left Ear: External ear normal.     Nose: Nose normal.     Mouth/Throat:     Mouth: Mucous membranes are moist.     Pharynx: Oropharynx is clear.  Eyes:     General: No scleral icterus.    Extraocular Movements: Extraocular movements intact.      Conjunctiva/sclera:  Conjunctivae normal.     Pupils: Pupils are equal, round, and reactive to light.  Cardiovascular:     Rate and Rhythm: Normal rate and regular rhythm.     Pulses: Normal pulses.     Heart sounds: Normal heart sounds.  Pulmonary:     Effort: Pulmonary effort is normal. No respiratory distress.     Breath sounds: Normal breath sounds.  Abdominal:     General: Abdomen is flat. There is no distension.     Palpations: Abdomen is soft.     Tenderness: There is abdominal tenderness.  Musculoskeletal:        General: No swelling or deformity. Normal range of motion.     Cervical back: Normal range of motion and neck supple.  Skin:    General: Skin is warm and dry.     Coloration: Skin is not jaundiced.  Neurological:     General: No focal deficit present.     Mental Status: She is alert and oriented to person, place, and time.  Psychiatric:        Mood and Affect: Mood normal.        Behavior: Behavior normal.      Assessment/Plan The patient appears to have symptomatic gallstones.  We will plan to admit her to the hospital for IV hydration and pain control.  Given that her symptoms are recurrent she may benefit from having her gallbladder removed during this admission.  We will discuss this again with her in the morning and think about timing of surgery.  Chevis Pretty III, MD 06/03/2022, 11:00 PM

## 2022-06-03 NOTE — ED Triage Notes (Signed)
Pt arrives c/o epigastric pain that radiates to back since around 1600 today. Hx of several gallbladder attacks and is scheduled for cholecystectomy on 06/14/22 at [facility]. Pt states that she called surgery center and they advised her to either "wait it out" or come to ED for evaluation. Denies n/v, fevers/chills. Denies taking meds for pain.

## 2022-06-04 ENCOUNTER — Observation Stay (HOSPITAL_COMMUNITY): Payer: BC Managed Care – PPO | Admitting: Certified Registered"

## 2022-06-04 ENCOUNTER — Encounter (HOSPITAL_COMMUNITY)
Admission: EM | Disposition: A | Discharge status: Home / Self Care | Payer: Self-pay | Source: Home / Self Care | Attending: Emergency Medicine

## 2022-06-04 ENCOUNTER — Encounter (HOSPITAL_COMMUNITY): Payer: Self-pay

## 2022-06-04 ENCOUNTER — Observation Stay (HOSPITAL_COMMUNITY): Payer: BC Managed Care – PPO

## 2022-06-04 HISTORY — PX: CHOLECYSTECTOMY: SHX55

## 2022-06-04 LAB — HEMOGLOBIN A1C
Hgb A1c MFr Bld: 5.7 % — ABNORMAL HIGH (ref 4.8–5.6)
Mean Plasma Glucose: 116.89 mg/dL

## 2022-06-04 LAB — GLUCOSE, CAPILLARY
Glucose-Capillary: 94 mg/dL (ref 70–99)
Glucose-Capillary: 122 mg/dL — ABNORMAL HIGH (ref 70–99)
Glucose-Capillary: 148 mg/dL — ABNORMAL HIGH (ref 70–99)
Glucose-Capillary: 122 mg/dL — ABNORMAL HIGH (ref 70–99)

## 2022-06-04 LAB — CBC
HCT: 38.9 % (ref 36.0–46.0)
Hemoglobin: 12.8 g/dL (ref 12.0–15.0)
MCH: 31.2 pg (ref 26.0–34.0)
MCHC: 32.9 g/dL (ref 30.0–36.0)
MCV: 94.9 fL (ref 80.0–100.0)
Platelets: 248 10*3/uL (ref 150–400)
RBC: 4.1 MIL/uL (ref 3.87–5.11)
RDW: 12 % (ref 11.5–15.5)
WBC: 9.8 10*3/uL (ref 4.0–10.5)
nRBC: 0 % (ref 0.0–0.2)

## 2022-06-04 LAB — HIV ANTIBODY (ROUTINE TESTING W REFLEX): HIV Screen 4th Generation wRfx: NONREACTIVE

## 2022-06-04 LAB — MRSA NEXT GEN BY PCR, NASAL: MRSA by PCR Next Gen: NOT DETECTED

## 2022-06-04 SURGERY — LAPAROSCOPIC CHOLECYSTECTOMY WITH INTRAOPERATIVE CHOLANGIOGRAM
Anesthesia: General | Site: Abdomen

## 2022-06-04 MED ORDER — FENTANYL CITRATE PF 50 MCG/ML IJ SOSY
PREFILLED_SYRINGE | INTRAMUSCULAR | Status: AC
  Filled 2022-06-04: qty 1

## 2022-06-04 MED ORDER — DEXAMETHASONE SODIUM PHOSPHATE 10 MG/ML IJ SOLN
INTRAMUSCULAR | Status: DC | PRN
  Administered 2022-06-04: 4 mg via INTRAVENOUS

## 2022-06-04 MED ORDER — FENTANYL CITRATE PF 50 MCG/ML IJ SOSY
50.0000 ug | PREFILLED_SYRINGE | INTRAMUSCULAR | Status: DC | PRN
  Administered 2022-06-04: 50 ug via INTRAVENOUS

## 2022-06-04 MED ORDER — LACTATED RINGERS IV SOLN: INTRAVENOUS | Status: DC

## 2022-06-04 MED ORDER — SCOPOLAMINE 1 MG/3DAYS TD PT72
MEDICATED_PATCH | TRANSDERMAL | Status: AC
  Filled 2022-06-04: qty 1

## 2022-06-04 MED ORDER — LIDOCAINE HCL (PF) 2 % IJ SOLN
INTRAMUSCULAR | Status: AC
  Filled 2022-06-04: qty 5

## 2022-06-04 MED ORDER — LACTATED RINGERS IV SOLN
INTRAVENOUS | Status: AC | PRN
  Administered 2022-06-04: 1000 mL

## 2022-06-04 MED ORDER — BUPIVACAINE-EPINEPHRINE (PF) 0.25% -1:200000 IJ SOLN
INTRAMUSCULAR | Status: AC
  Filled 2022-06-04: qty 30

## 2022-06-04 MED ORDER — ACETAMINOPHEN 10 MG/ML IV SOLN
INTRAVENOUS | Status: AC
  Filled 2022-06-04: qty 100

## 2022-06-04 MED ORDER — SCOPOLAMINE 1 MG/3DAYS TD PT72: 1.0000 | MEDICATED_PATCH | TRANSDERMAL | Status: DC

## 2022-06-04 MED ORDER — ONDANSETRON HCL 4 MG/2ML IJ SOLN
INTRAMUSCULAR | Status: AC
  Filled 2022-06-04: qty 2

## 2022-06-04 MED ORDER — FENTANYL CITRATE (PF) 100 MCG/2ML IJ SOLN
INTRAMUSCULAR | Status: DC | PRN
  Administered 2022-06-04 (×2): 50 ug via INTRAVENOUS

## 2022-06-04 MED ORDER — LIDOCAINE 2% (20 MG/ML) 5 ML SYRINGE
INTRAMUSCULAR | Status: DC | PRN
  Administered 2022-06-04: 40 mg via INTRAVENOUS

## 2022-06-04 MED ORDER — ONDANSETRON HCL 4 MG/2ML IJ SOLN
INTRAMUSCULAR | Status: DC | PRN
  Administered 2022-06-04: 4 mg via INTRAVENOUS

## 2022-06-04 MED ORDER — MIDAZOLAM HCL 2 MG/2ML IJ SOLN
INTRAMUSCULAR | Status: DC | PRN
  Administered 2022-06-04: 2 mg via INTRAVENOUS

## 2022-06-04 MED ORDER — SUGAMMADEX SODIUM 200 MG/2ML IV SOLN
INTRAVENOUS | Status: DC | PRN
  Administered 2022-06-04: 200 mg via INTRAVENOUS

## 2022-06-04 MED ORDER — MIDAZOLAM HCL 2 MG/2ML IJ SOLN
INTRAMUSCULAR | Status: AC
  Filled 2022-06-04: qty 2

## 2022-06-04 MED ORDER — CHLORHEXIDINE GLUCONATE CLOTH 2 % EX PADS
6.0000 | MEDICATED_PAD | Freq: Once | CUTANEOUS | Status: AC
  Administered 2022-06-04: 6 via TOPICAL

## 2022-06-04 MED ORDER — ACETAMINOPHEN 10 MG/ML IV SOLN
INTRAVENOUS | Status: DC | PRN
  Administered 2022-06-04: 1000 mg via INTRAVENOUS

## 2022-06-04 MED ORDER — CEFAZOLIN SODIUM-DEXTROSE 2-4 GM/100ML-% IV SOLN
INTRAVENOUS | Status: AC
  Filled 2022-06-04: qty 100

## 2022-06-04 MED ORDER — CIPROFLOXACIN IN D5W 400 MG/200ML IV SOLN: 400.0000 mg | INTRAVENOUS | Status: DC

## 2022-06-04 MED ORDER — CHLORHEXIDINE GLUCONATE CLOTH 2 % EX PADS: 6.0000 | MEDICATED_PAD | Freq: Once | CUTANEOUS | Status: AC

## 2022-06-04 MED ORDER — DEXAMETHASONE SODIUM PHOSPHATE 4 MG/ML IJ SOLN: 4.0000 mg | INTRAMUSCULAR | Status: DC

## 2022-06-04 MED ORDER — BUPIVACAINE-EPINEPHRINE 0.25% -1:200000 IJ SOLN
INTRAMUSCULAR | Status: DC | PRN
  Administered 2022-06-04: 22 mL

## 2022-06-04 MED ORDER — PROPOFOL 10 MG/ML IV BOLUS
INTRAVENOUS | Status: AC
  Filled 2022-06-04: qty 20

## 2022-06-04 MED ORDER — CEFAZOLIN SODIUM-DEXTROSE 2-3 GM-%(50ML) IV SOLR
INTRAVENOUS | Status: DC | PRN
  Administered 2022-06-04: 2 g via INTRAVENOUS

## 2022-06-04 MED ORDER — PROPOFOL 500 MG/50ML IV EMUL: INTRAVENOUS | Status: DC | PRN

## 2022-06-04 MED ORDER — FENTANYL CITRATE (PF) 100 MCG/2ML IJ SOLN
INTRAMUSCULAR | Status: AC
  Filled 2022-06-04: qty 2

## 2022-06-04 MED ORDER — ACETAMINOPHEN 500 MG PO TABS: 1000.0000 mg | ORAL_TABLET | ORAL | Status: DC

## 2022-06-04 MED ORDER — TRAMADOL HCL 50 MG PO TABS
50.0000 mg | ORAL_TABLET | Freq: Four times a day (QID) | ORAL | Status: DC | PRN
  Administered 2022-06-04 – 2022-06-05 (×3): 50 mg via ORAL
  Filled 2022-06-04 (×3): qty 1

## 2022-06-04 MED ORDER — PROPOFOL 10 MG/ML IV BOLUS
INTRAVENOUS | Status: DC | PRN
  Administered 2022-06-04: 100 mg via INTRAVENOUS
  Administered 2022-06-04: 150 ug/kg/min via INTRAVENOUS

## 2022-06-04 MED ORDER — ROCURONIUM BROMIDE 10 MG/ML (PF) SYRINGE
PREFILLED_SYRINGE | INTRAVENOUS | Status: DC | PRN
  Administered 2022-06-04: 50 mg via INTRAVENOUS
  Administered 2022-06-04: 10 mg via INTRAVENOUS

## 2022-06-04 SURGICAL SUPPLY — 32 items
APPLIER CLIP 5 13 M/L LIGAMAX5 (MISCELLANEOUS) ×1
BAG COUNTER SPONGE SURGICOUNT (BAG) IMPLANT
CABLE HIGH FREQUENCY MONO STRZ (ELECTRODE) ×1 IMPLANT
CATH REDDICK CHOLANGI 4FR 50CM (CATHETERS) ×1 IMPLANT
CHLORAPREP W/TINT 26 (MISCELLANEOUS) ×1 IMPLANT
CLIP APPLIE 5 13 M/L LIGAMAX5 (MISCELLANEOUS) ×1 IMPLANT
COVER MAYO STAND XLG (MISCELLANEOUS) ×1 IMPLANT
DERMABOND ADVANCED .7 DNX12 (GAUZE/BANDAGES/DRESSINGS) ×1 IMPLANT
DRAPE C-ARM 42X120 X-RAY (DRAPES) ×1 IMPLANT
ELECT REM PT RETURN 15FT ADLT (MISCELLANEOUS) ×1 IMPLANT
GLOVE BIO SURGEON STRL SZ7.5 (GLOVE) ×1 IMPLANT
GOWN STRL REUS W/ TWL LRG LVL3 (GOWN DISPOSABLE) IMPLANT
GOWN STRL REUS W/TWL LRG LVL3 (GOWN DISPOSABLE)
HEMOSTAT SNOW SURGICEL 2X4 (HEMOSTASIS) IMPLANT
IRRIG SUCT STRYKERFLOW 2 WTIP (MISCELLANEOUS) ×1
IRRIGATION SUCT STRKRFLW 2 WTP (MISCELLANEOUS) ×1 IMPLANT
IV CATH 14GX2 1/4 (CATHETERS) ×1 IMPLANT
KIT BASIN OR (CUSTOM PROCEDURE TRAY) ×1 IMPLANT
KIT TURNOVER KIT A (KITS) IMPLANT
PENCIL SMOKE EVACUATOR (MISCELLANEOUS) IMPLANT
SCISSORS LAP 5X35 DISP (ENDOMECHANICALS) ×1 IMPLANT
SET TUBE SMOKE EVAC HIGH FLOW (TUBING) ×1 IMPLANT
SLEEVE Z-THREAD 5X100MM (TROCAR) ×2 IMPLANT
SPIKE FLUID TRANSFER (MISCELLANEOUS) ×1 IMPLANT
SUT MNCRL AB 4-0 PS2 18 (SUTURE) ×1 IMPLANT
SYS BAG RETRIEVAL 10MM (BASKET) ×1
SYSTEM BAG RETRIEVAL 10MM (BASKET) ×1 IMPLANT
TOWEL OR 17X26 10 PK STRL BLUE (TOWEL DISPOSABLE) ×1 IMPLANT
TOWEL OR NON WOVEN STRL DISP B (DISPOSABLE) ×1 IMPLANT
TRAY LAPAROSCOPIC (CUSTOM PROCEDURE TRAY) ×1 IMPLANT
TROCAR BALLN 12MMX100 BLUNT (TROCAR) ×1 IMPLANT
TROCAR Z-THREAD OPTICAL 5X100M (TROCAR) ×1 IMPLANT

## 2022-06-04 NOTE — Anesthesia Procedure Notes (Signed)
Procedure Name: Intubation Date/Time: 06/04/2022 11:05 AM  Performed by: Sindy Guadeloupe, CRNAPre-anesthesia Checklist: Patient identified, Emergency Drugs available, Suction available, Patient being monitored and Timeout performed Patient Re-evaluated:Patient Re-evaluated prior to induction Oxygen Delivery Method: Circle system utilized Preoxygenation: Pre-oxygenation with 100% oxygen Induction Type: IV induction Ventilation: Mask ventilation without difficulty Laryngoscope Size: Mac and 4 Grade View: Grade II Tube type: Oral Tube size: 7.0 mm Number of attempts: 1 Airway Equipment and Method: Stylet Placement Confirmation: ETT inserted through vocal cords under direct vision, positive ETCO2 and breath sounds checked- equal and bilateral Secured at: 21 cm Tube secured with: Tape Dental Injury: Teeth and Oropharynx as per pre-operative assessment

## 2022-06-04 NOTE — ED Notes (Signed)
Pt assisted to restroom with wheelchair and reports increased pain and nausea

## 2022-06-04 NOTE — Progress Notes (Signed)
  Transition of Care [facility]) Screening Note   Patient Details  Name: Whitney Donovan Date of Birth: [birth year]   [facility] CM/SW Contact:    Adrian Prows, RN Phone Number: 06/04/2022, 4:48 PM    Transition of Care Department [facility]) has reviewed patient and no TOC needs have been identified at this time. We will continue to monitor patient advancement through interdisciplinary progression rounds. If new patient transition needs arise, please place a TOC consult.

## 2022-06-04 NOTE — Op Note (Signed)
06/03/2022 - 06/04/2022  12:01 PM  PATIENT:  Whitney Donovan  57 y.o. female  PRE-OPERATIVE DIAGNOSIS:  CHOLELITHIASIS  POST-OPERATIVE DIAGNOSIS:  CHOLELITHIASIS WITH ACUTE CHOLECYSTITIS  PROCEDURE:  Procedure(s): LAPAROSCOPIC CHOLECYSTECTOMY (N/A)  SURGEON:  Surgeon(s) and Role:    * Griselda Miner, MD - Primary  PHYSICIAN ASSISTANT:   ASSISTANTS: none   ANESTHESIA:   local and general  EBL:  minimal   BLOOD ADMINISTERED:none  DRAINS: none   LOCAL MEDICATIONS USED:  MARCAINE     SPECIMEN:  Source of Specimen:  gallbladder  DISPOSITION OF SPECIMEN:  PATHOLOGY  COUNTS:  YES  TOURNIQUET:  * No tourniquets in log *  DICTATION: .Dragon Dictation    Procedure: After informed consent was obtained the patient was brought to the operating room and placed in the supine position on the operating room table. After adequate induction of general anesthesia the patient's abdomen was prepped with ChloraPrep allowed to dry and draped in usual sterile manner. An appropriate timeout was performed. The area below the umbilicus was infiltrated with quarter percent  Marcaine. A small incision was made with a 15 blade knife. The incision was carried down through the subcutaneous tissue bluntly with a hemostat and [employer] retractors. The linea alba was identified. The linea alba was incised with a 15 blade knife and each side was grasped with Coker clamps. The preperitoneal space was then probed with a hemostat until the peritoneum was opened and access was gained to the abdominal cavity. A 0 Vicryl pursestring stitch was placed in the fascia surrounding the opening. A Hassan cannula was then placed through the opening and anchored in place with the previously placed Vicryl purse string stitch. The abdomen was insufflated with carbon dioxide without difficulty. A laparoscope was inserted through the [place] cannula in the right upper quadrant was inspected. Next the epigastric region was infiltrated  with % Marcaine. A small incision was made with a 15 blade knife. A 5 mm port was placed bluntly through this incision into the abdominal cavity under direct vision. Next 2 sites were chosen laterally on the right side of the abdomen for placement of 5 mm ports. Each of these areas was infiltrated with quarter percent Marcaine. Small stab incisions were made with a 15 blade knife. 5 mm ports were then placed bluntly through these incisions into the abdominal cavity under direct vision without difficulty. A blunt grasper was placed through the lateralmost 5 mm port and used to grasp the dome of the gallbladder and elevate it anteriorly and superiorly. Another blunt grasper was placed through the other 5 mm port and used to retract the body and neck of the gallbladder. A dissector was placed through the epigastric port and using the electrocautery the peritoneal reflection at the gallbladder neck was opened. Blunt dissection was then carried out in this area until the gallbladder neck-cystic duct junction was readily identified and a good critical window was created.  3 clips were placed proximally on the cystic duct and one distally and the duct was divided between the 2 sets of clips. Posterior to this the cystic artery was identified and again dissected bluntly in a circumferential manner until a good window  was created. 2 clips were placed proximally and one distally on the artery and the artery was divided between the 2 sets of clips. Next a laparoscopic hook cautery device was used to separate the gallbladder from the liver bed. Prior to completely detaching the gallbladder from the liver bed the  liver bed was inspected and several small bleeding points were coagulated with the electrocautery until the area was completely hemostatic. The gallbladder was then detached the rest of it from the liver bed without difficulty. A laparoscopic bag was inserted through the hassan port. The laparoscope was moved to the  epigastric port. The gallbladder was placed within the bag and the bag was sealed.  The bag with the gallbladder was then removed with the [facility] cannula through the infraumbilical port without difficulty. The fascial defect was then closed with the previously placed Vicryl pursestring stitch as well as with another figure-of-eight 0 Vicryl stitch. The liver bed was inspected again and found to be hemostatic. The abdomen was irrigated with copious amounts of saline until the effluent was clear. The ports were then removed under direct vision without difficulty and were found to be hemostatic. The gas was allowed to escape. No other abnormalities were noted on general inspection of the abdomen. The skin incisions were all closed with interrupted 4-0 Monocryl subcuticular stitches. Dermabond dressings were applied. The patient tolerated the procedure well. At the end of the case all needle sponge and instrument counts were correct. The patient was then awakened and taken to recovery in stable condition  PLAN OF CARE: Admit for overnight observation  PATIENT DISPOSITION:  PACU - hemodynamically stable.   Delay start of Pharmacological VTE agent (>24hrs) due to surgical blood loss or risk of bleeding: not applicable

## 2022-06-04 NOTE — Transfer of Care (Signed)
Immediate Anesthesia Transfer of Care Note  Patient: Whitney Donovan  Procedure(s) Performed: LAPAROSCOPIC CHOLECYSTECTOMY (Abdomen)  Patient Location: PACU  Anesthesia Type:General  Level of Consciousness: drowsy and patient cooperative  Airway & Oxygen Therapy: Patient Spontanous Breathing and Patient connected to face mask oxygen  Post-op Assessment: Report given to RN and Post -op Vital signs reviewed and stable  Post vital signs: Reviewed and stable  Last Vitals:  Vitals Value Taken Time  BP 139/77 06/04/22 1216  Temp    Pulse 86 06/04/22 1218  Resp 19 06/04/22 1218  SpO2 100 % 06/04/22 1218  Vitals shown include unvalidated device data.  Last Pain:  Vitals:   06/04/22 0840  TempSrc:   PainSc: Asleep      Patients Stated Pain Goal: 0 (06/04/22 1610)  Complications: No notable events documented.

## 2022-06-04 NOTE — Progress Notes (Signed)
   06/04/22 0820  Incentive Spirometry Tx  Level of Service Assisted by RCP  Frequency q1hr W/A  Treatment Tolerance Tolerated well  IS Goal (mL) (RN or RT) 2200 mL  IS - Achieved (mL) (RN, NT, or RT) 1750 mL

## 2022-06-04 NOTE — Anesthesia Preprocedure Evaluation (Addendum)
Anesthesia Evaluation  Patient identified by MRN, date of birth, ID band Patient awake    Reviewed: Allergy & Precautions, NPO status , Patient's Chart, lab work & pertinent test results  History of Anesthesia Complications (+) PONV and history of anesthetic complications  Airway Mallampati: II  TM Distance: >3 FB Neck ROM: Full    Dental no notable dental hx. (+) Teeth Intact, Dental Advisory Given   Pulmonary former smoker   Pulmonary exam normal breath sounds clear to auscultation       Cardiovascular negative cardio ROS Normal cardiovascular exam Rhythm:Regular Rate:Normal     Neuro/Psych  PSYCHIATRIC DISORDERS Anxiety Depression    negative neurological ROS     GI/Hepatic negative GI ROS, Neg liver ROS,,,  Endo/Other  diabetes, Type 2, Oral Hypoglycemic AgentsHypothyroidism    Renal/GU negative Renal ROS  negative genitourinary   Musculoskeletal negative musculoskeletal ROS (+)    Abdominal   Peds  Hematology negative hematology ROS (+)   Anesthesia Other Findings   Reproductive/Obstetrics                             Anesthesia Physical Anesthesia Plan  ASA: 2  Anesthesia Plan: General   Post-op Pain Management:    Induction: Intravenous  PONV Risk Score and Plan: Midazolam, Dexamethasone, Ondansetron, Scopolamine patch - Pre-op and TIVA  Airway Management Planned: Oral ETT  Additional Equipment:   Intra-op Plan:   Post-operative Plan: Extubation in OR  Informed Consent: I have reviewed the patients History and Physical, chart, labs and discussed the procedure including the risks, benefits and alternatives for the proposed anesthesia with the patient or authorized representative who has indicated his/her understanding and acceptance.     Dental advisory given  Plan Discussed with: CRNA  Anesthesia Plan Comments:        Anesthesia Quick Evaluation

## 2022-06-04 NOTE — Progress Notes (Signed)
   Subjective/Chief Complaint: Complains of persistent pain   Objective: Vital signs in last 24 hours: Temp:  [97.7 F (36.5 C)-98.5 F (36.9 C)] 97.9 F (36.6 C) (05/11 0635) Pulse Rate:  [77-84] 79 (05/11 0635) Resp:  [15-18] 18 (05/11 0635) BP: (102-146)/(52-79) 131/69 (05/11 0635) SpO2:  [97 %-100 %] 100 % (05/11 0635) Weight:  [63.5 kg] 63.5 kg (05/10 2038) Last BM Date : 06/03/22  Intake/Output from previous day: 05/10 0701 - 05/11 0700 In: 2000 [I.V.:1000; IV Piggyback:1000] Out: -  Intake/Output this shift: No intake/output data recorded.  General appearance: alert and cooperative Resp: clear to auscultation bilaterally Cardio: regular rate and rhythm GI: soft, moderate diffuse tenderness but no guarding  Lab Results:  Recent Labs    06/03/22 2052 06/04/22 0702  WBC 12.2* 9.8  HGB 14.3 12.8  HCT 42.4 38.9  PLT 283 248   BMET Recent Labs    06/03/22 2052  NA 138  K 3.5  CL 107  CO2 18*  GLUCOSE 91  BUN 11  CREATININE 0.71  CALCIUM 9.0   PT/INR No results for input(s): "LABPROT", "INR" in the last 72 hours. ABG No results for input(s): "PHART", "HCO3" in the last 72 hours.  Invalid input(s): "PCO2", "PO2"  Studies/Results: No results found.  Anti-infectives: Anti-infectives (From admission, onward)    None       Assessment/Plan: s/p Procedure(s): LAPAROSCOPIC CHOLECYSTECTOMY POSSIBLE INTRAOPERATIVE CHOLANGIOGRAM (N/A) Plan for lap chole today. Risks and benefits of the surgery discussed with pt including cbd injury and she understands and wishes to proceed  LOS: 0 days    Chevis Pretty III 06/04/2022

## 2022-06-05 ENCOUNTER — Encounter (HOSPITAL_COMMUNITY): Payer: Self-pay | Admitting: General Surgery

## 2022-06-05 LAB — GLUCOSE, CAPILLARY: Glucose-Capillary: 95 mg/dL (ref 70–99)

## 2022-06-05 MED ORDER — TRAMADOL HCL 50 MG PO TABS: 50.0000 mg | ORAL_TABLET | Freq: Four times a day (QID) | ORAL | 0 refills | Status: AC | PRN

## 2022-06-05 NOTE — Progress Notes (Signed)
1 Day Post-Op   Subjective/Chief Complaint: Complains of soreness but feels better   Objective: Vital signs in last 24 hours: Temp:  [97.6 F (36.4 C)-98.2 F (36.8 C)] 97.9 F (36.6 C) (05/12 0458) Pulse Rate:  [66-83] 68 (05/12 0458) Resp:  [14-20] 20 (05/12 0458) BP: (93-133)/(51-88) 93/52 (05/12 0458) SpO2:  [96 %-100 %] 98 % (05/12 0458) Last BM Date : 06/03/22  Intake/Output from previous day: 05/11 0701 - 05/12 0700 In: 596.6 [I.V.:446.6; IV Piggyback:150] Out: 20 [Blood:20] Intake/Output this shift: No intake/output data recorded.  General appearance: alert and cooperative Resp: clear to auscultation bilaterally Cardio: regular rate and rhythm GI: soft, mild tenderness. Incisions look good  Lab Results:  Recent Labs    06/03/22 2052 06/04/22 0702  WBC 12.2* 9.8  HGB 14.3 12.8  HCT 42.4 38.9  PLT 283 248   BMET Recent Labs    06/03/22 2052  NA 138  K 3.5  CL 107  CO2 18*  GLUCOSE 91  BUN 11  CREATININE 0.71  CALCIUM 9.0   PT/INR No results for input(s): "LABPROT", "INR" in the last 72 hours. ABG No results for input(s): "PHART", "HCO3" in the last 72 hours.  Invalid input(s): "PCO2", "PO2"  Studies/Results: DG C-Arm 1-60 Min-No Report  Result Date: 06/04/2022 Fluoroscopy was utilized by the requesting physician.  No radiographic interpretation.    Anti-infectives: Anti-infectives (From admission, onward)    Start     Dose/Rate Route Frequency Ordered Stop   06/05/22 0600  ciprofloxacin (CIPRO) IVPB 400 mg  Status:  Discontinued        400 mg 200 mL/hr over 60 Minutes Intravenous On call to O.R. 06/04/22 1210 06/04/22 1307   06/04/22 1018  ceFAZolin (ANCEF) 2-4 GM/100ML-% IVPB       Note to Pharmacy: Myrlene Broker M: cabinet override      06/04/22 1018 06/04/22 1311       Assessment/Plan: s/p Procedure(s): LAPAROSCOPIC CHOLECYSTECTOMY (N/A) Advance diet Discharge  LOS: 0 days    Whitney Donovan 06/05/2022

## 2022-06-05 NOTE — Discharge Summary (Signed)
Physician Discharge Summary  Patient ID: Whitney Donovan MRN: [id number] DOB/AGE: [birth year] 57 y.o.  Admit date: 06/03/2022 Discharge date: 06/05/2022  Admission Diagnoses:  Discharge Diagnoses:  Principal Problem:   Cholelithiasis   Discharged Condition: good  Hospital Course: the pt underwent lap chole. On pod 1 she was ready for d/c home  Consults: None  Significant Diagnostic Studies: none  Treatments: surgery: as above  Discharge Exam: Blood pressure (!) 93/52, pulse 68, temperature 97.9 F (36.6 C), temperature source Oral, resp. rate 20, height 5\' 5"  (1.651 m), weight 63.5 kg, SpO2 98 %. GI: soft, mild tenderness. Incisions look good  Disposition: Discharge disposition: 01-Home or Self Care       Discharge Instructions     Call MD for:  difficulty breathing, headache or visual disturbances   Complete by: As directed    Call MD for:  extreme fatigue   Complete by: As directed    Call MD for:  hives   Complete by: As directed    Call MD for:  persistant dizziness or light-headedness   Complete by: As directed    Call MD for:  persistant nausea and vomiting   Complete by: As directed    Call MD for:  redness, tenderness, or signs of infection (pain, swelling, redness, odor or green/yellow discharge around incision site)   Complete by: As directed    Call MD for:  severe uncontrolled pain   Complete by: As directed    Call MD for:  temperature >100.4   Complete by: As directed    Diet - low sodium heart healthy   Complete by: As directed    Discharge instructions   Complete by: As directed    May shower. No heavy lifting. Low fat diet   Increase activity slowly   Complete by: As directed    No wound care   Complete by: As directed       Allergies as of 06/05/2022       Reactions   Betadine [povidone Iodine]    Causes blistering    Codeine Nausea Only   Penicillins Rash   Did it involve swelling of the face/tongue/throat, SOB, or low BP? No Did  it involve sudden or severe rash/hives, skin peeling, or any reaction on the inside of your mouth or nose? Yes Did you need to seek medical attention at a hospital or doctor's office? No When did it last happen?  Over 30 Years Ago     If all above answers are "NO", may proceed with cephalosporin use.          Medication List     TAKE these medications    alendronate 70 MG tablet Commonly known as: FOSAMAX Take 70 mg by mouth once a week.   ALPRAZolam 0.5 MG tablet Commonly known as: XANAX Take 0.5 mg by mouth at bedtime.   ergocalciferol 1.25 MG (50000 UT) capsule Commonly known as: VITAMIN D2 Take 50,000 Units by mouth once a week.   furosemide 40 MG tablet Commonly known as: LASIX Take 40 mg by mouth daily.   metFORMIN 500 MG 24 hr tablet Commonly known as: GLUCOPHAGE-XR Take 500 mg by mouth 2 (two) times daily with a meal.   thyroid 120 MG tablet Commonly known as: ARMOUR Take 120 mg by mouth every other day.   NP Thyroid 90 MG tablet Generic drug: thyroid Take 90 mg by mouth every other day.   traMADol 50 MG tablet Commonly known as: ULTRAM Take  1 tablet (50 mg total) by mouth every 6 (six) hours as needed for moderate pain.        Follow-up Information     Chevis Pretty III, MD Follow up in 2 week(s).   Specialty: General Surgery Contact information: [address] [place] [address] [place] [address] [phone]                 Signed: Chevis Pretty III 06/05/2022, 7:43 AM

## 2022-06-06 NOTE — Anesthesia Postprocedure Evaluation (Signed)
Anesthesia Post Note  Patient: Whitney Donovan  Procedure(s) Performed: LAPAROSCOPIC CHOLECYSTECTOMY (Abdomen)     Patient location during evaluation: PACU Anesthesia Type: General Level of consciousness: awake and alert Pain management: pain level controlled Vital Signs Assessment: post-procedure vital signs reviewed and stable Respiratory status: spontaneous breathing, nonlabored ventilation, respiratory function stable and patient connected to nasal cannula oxygen Cardiovascular status: blood pressure returned to baseline and stable Postop Assessment: no apparent nausea or vomiting Anesthetic complications: no  No notable events documented.  Last Vitals:  Vitals:   06/05/22 0802 06/05/22 0857  BP: (!) 103/57 92/70  Pulse:  63  Resp:  20  Temp:  37 C  SpO2:  98%    Last Pain:  Vitals:   06/05/22 0857  TempSrc: Oral  PainSc:                  Gwendoline Judy L Kajuan Guyton

## 2022-06-07 ENCOUNTER — Inpatient Hospital Stay (HOSPITAL_COMMUNITY): Admission: RE | Admit: 2022-06-07 | Payer: BC Managed Care – PPO | Source: Ambulatory Visit

## 2022-06-07 LAB — SURGICAL PATHOLOGY

## 2022-06-14 ENCOUNTER — Ambulatory Visit: Admit: 2022-06-14 | Payer: BC Managed Care – PPO | Admitting: General Surgery

## 2022-06-14 SURGERY — LAPAROSCOPIC CHOLECYSTECTOMY
Anesthesia: General

## 2023-03-13 ENCOUNTER — Encounter: Payer: Self-pay | Admitting: Obstetrics and Gynecology

## 2023-03-13 ENCOUNTER — Other Ambulatory Visit: Payer: Self-pay | Admitting: Obstetrics and Gynecology

## 2023-03-13 DIAGNOSIS — Z1231 Encounter for screening mammogram for malignant neoplasm of breast: Secondary | ICD-10-CM

## 2023-04-04 ENCOUNTER — Ambulatory Visit
Admission: RE | Admit: 2023-04-04 | Discharge: 2023-04-04 | Disposition: A | Discharge status: Home / Self Care | Payer: Self-pay | Source: Ambulatory Visit | Attending: Obstetrics and Gynecology | Admitting: Obstetrics and Gynecology

## 2023-04-04 DIAGNOSIS — Z1231 Encounter for screening mammogram for malignant neoplasm of breast: Secondary | ICD-10-CM

## 2023-08-26 IMAGING — MG MM DIGITAL SCREENING BILAT W/ TOMO AND CAD
8 series · 8 of 24 positions shown · non-contrast
Comparison: Previous exam(s).

CLINICAL DATA: Screening.

EXAM:
DIGITAL SCREENING BILATERAL MAMMOGRAM WITH TOMOSYNTHESIS AND CAD
TECHNIQUE: Bilateral screening digital craniocaudal and mediolateral oblique
mammograms were obtained. Bilateral screening digital breast
tomosynthesis was performed. The images were evaluated with
computer-aided detection.

[L MLO synth-2D]
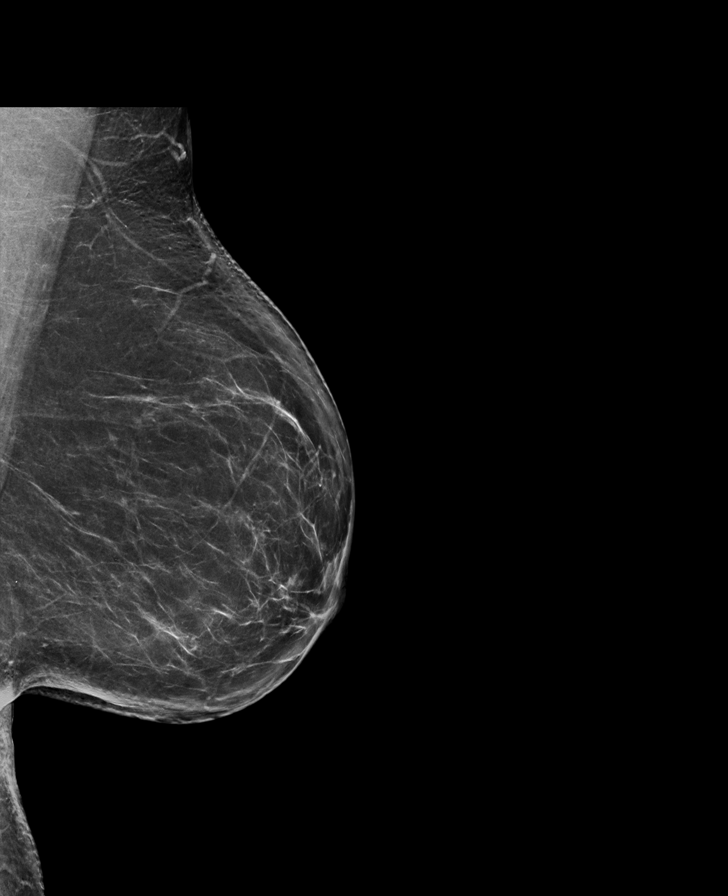

[L CC synth-2D]
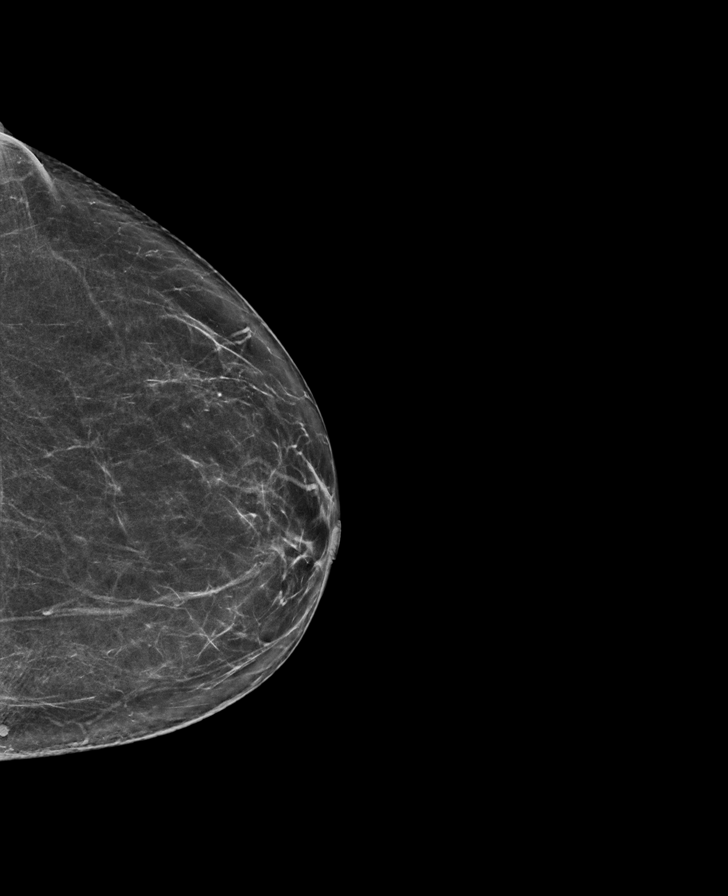

[R MLO synth-2D]
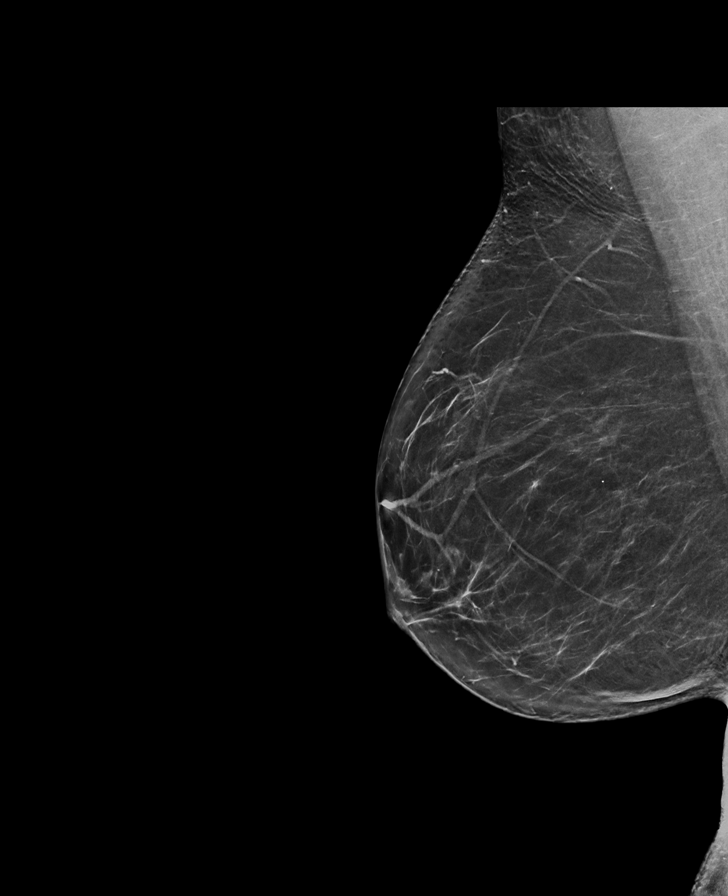

[R CC synth-2D]
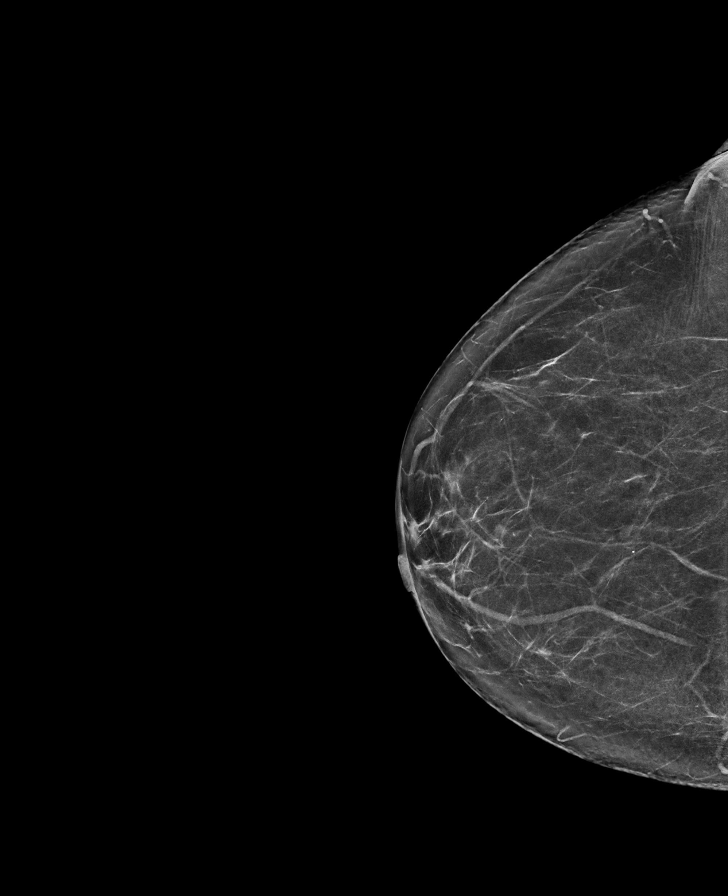

[R CC tomo · tomo slice 35/70.0]
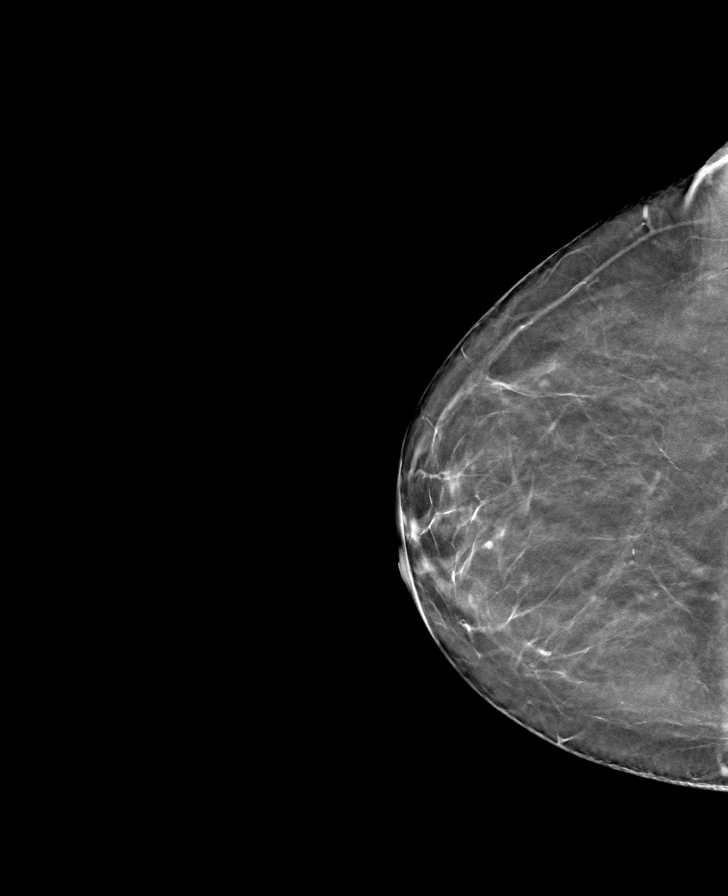

[R MLO tomo · tomo slice 37/74.0]
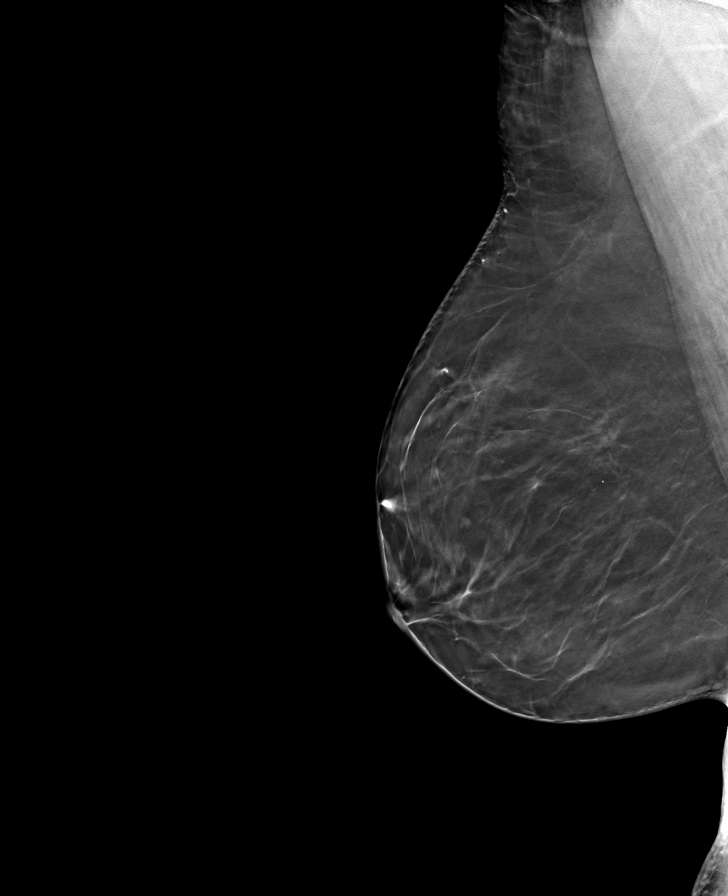

[L MLO tomo · tomo slice 38/75.0]
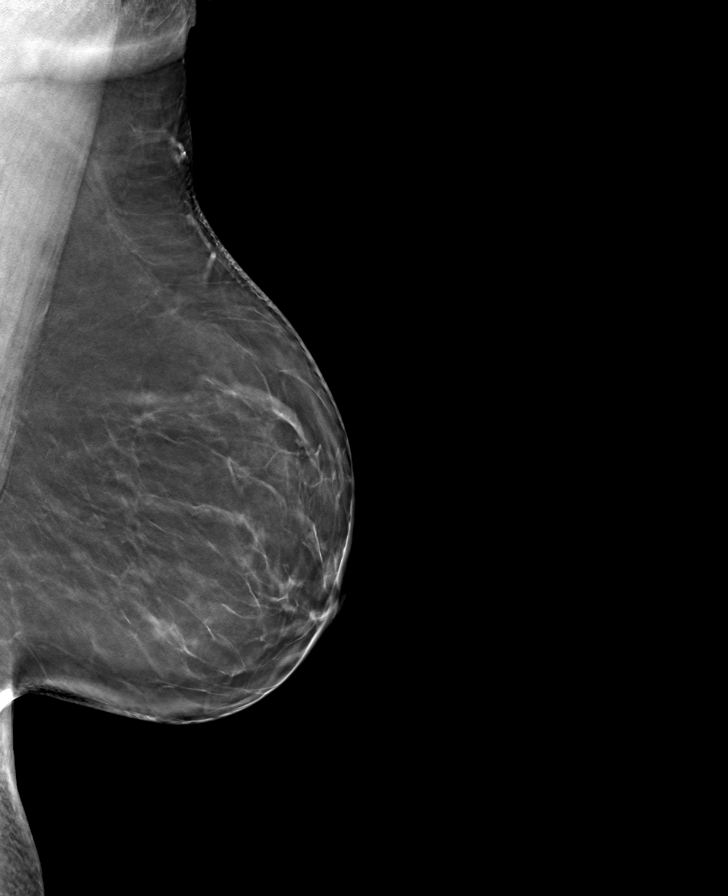

[L CC tomo · tomo slice 35/70.0]
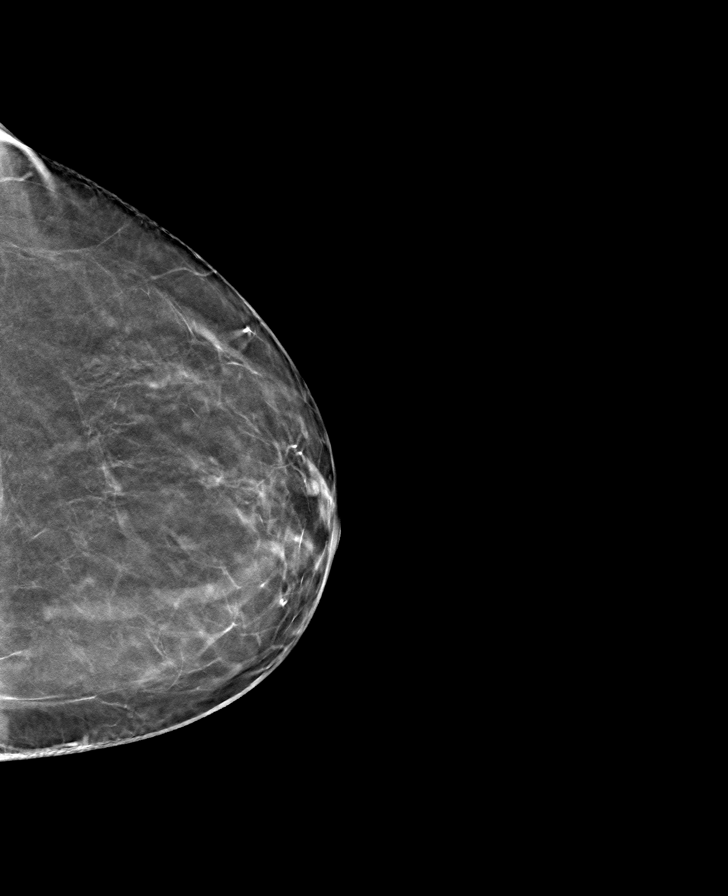

[8 of 24 positions shown; findings below may reference images not displayed]

ACR Breast Density Category b: There are scattered areas of
fibroglandular density.
FINDINGS: There are no findings suspicious for malignancy.
IMPRESSION: No mammographic evidence of malignancy. A result letter of this
screening mammogram will be mailed directly to the patient.

RECOMMENDATION:
Screening mammogram in one year. (Code:51-O-LD2)

BI-RADS CATEGORY  1: Negative.

## 2023-08-30 ENCOUNTER — Other Ambulatory Visit: Payer: Self-pay | Admitting: Obstetrics and Gynecology

## 2023-08-30 DIAGNOSIS — N644 Mastodynia: Secondary | ICD-10-CM

## 2023-09-07 ENCOUNTER — Ambulatory Visit: Admission: RE | Admit: 2023-09-07 | Payer: Self-pay | Source: Ambulatory Visit

## 2023-09-07 ENCOUNTER — Ambulatory Visit
Admission: RE | Admit: 2023-09-07 | Discharge: 2023-09-07 | Disposition: A | Discharge status: Home / Self Care | Payer: Self-pay | Source: Ambulatory Visit | Attending: Obstetrics and Gynecology | Admitting: Obstetrics and Gynecology

## 2023-09-07 DIAGNOSIS — N644 Mastodynia: Secondary | ICD-10-CM

## 2023-10-19 ENCOUNTER — Telehealth: Payer: Self-pay

## 2023-10-26 ENCOUNTER — Encounter: Payer: Self-pay | Admitting: Gastroenterology

## 2023-10-27 ENCOUNTER — Other Ambulatory Visit: Payer: Self-pay

## 2023-10-27 ENCOUNTER — Ambulatory Visit: Admitting: Anesthesiology

## 2023-10-27 ENCOUNTER — Encounter: Payer: Self-pay | Admitting: Gastroenterology

## 2023-10-27 ENCOUNTER — Encounter
Admission: RE | Disposition: A | Discharge status: Home / Self Care | Payer: Self-pay | Source: Home / Self Care | Attending: Gastroenterology

## 2023-10-27 ENCOUNTER — Ambulatory Visit
Admission: RE | Admit: 2023-10-27 | Discharge: 2023-10-27 | Disposition: A | Discharge status: Home / Self Care | Payer: Self-pay | Attending: Gastroenterology | Admitting: Gastroenterology

## 2023-10-27 DIAGNOSIS — D125 Benign neoplasm of sigmoid colon: Secondary | ICD-10-CM | POA: Diagnosis not present

## 2023-10-27 DIAGNOSIS — D122 Benign neoplasm of ascending colon: Secondary | ICD-10-CM | POA: Insufficient documentation

## 2023-10-27 DIAGNOSIS — E119 Type 2 diabetes mellitus without complications: Secondary | ICD-10-CM | POA: Insufficient documentation

## 2023-10-27 DIAGNOSIS — Z87891 Personal history of nicotine dependence: Secondary | ICD-10-CM | POA: Diagnosis not present

## 2023-10-27 DIAGNOSIS — E039 Hypothyroidism, unspecified: Secondary | ICD-10-CM | POA: Diagnosis not present

## 2023-10-27 DIAGNOSIS — Z1211 Encounter for screening for malignant neoplasm of colon: Secondary | ICD-10-CM | POA: Insufficient documentation

## 2023-10-27 DIAGNOSIS — Z7984 Long term (current) use of oral hypoglycemic drugs: Secondary | ICD-10-CM | POA: Insufficient documentation

## 2023-10-27 HISTORY — PX: COLONOSCOPY: SHX5424

## 2023-10-27 HISTORY — PX: POLYPECTOMY: SHX149

## 2023-10-27 HISTORY — DX: Myoneural disorder, unspecified: G70.9

## 2023-10-27 LAB — GLUCOSE, CAPILLARY: Glucose-Capillary: 102 mg/dL — ABNORMAL HIGH (ref 70–99)

## 2023-10-27 SURGERY — COLONOSCOPY
Anesthesia: General

## 2023-10-27 MED ORDER — LIDOCAINE HCL (CARDIAC) PF 100 MG/5ML IV SOSY
PREFILLED_SYRINGE | INTRAVENOUS | Status: DC | PRN
  Administered 2023-10-27: 60 mg via INTRAVENOUS

## 2023-10-27 MED ORDER — ONDANSETRON HCL 4 MG/2ML IJ SOLN
INTRAMUSCULAR | Status: AC
  Filled 2023-10-27: qty 2

## 2023-10-27 MED ORDER — STERILE WATER FOR IRRIGATION IR SOLN
Status: DC | PRN
  Administered 2023-10-27: 60 mL

## 2023-10-27 MED ORDER — PROPOFOL 500 MG/50ML IV EMUL
INTRAVENOUS | Status: DC | PRN
  Administered 2023-10-27: 140 ug/kg/min via INTRAVENOUS
  Administered 2023-10-27: 50 mg via INTRAVENOUS

## 2023-10-27 MED ORDER — ONDANSETRON HCL 4 MG/2ML IJ SOLN
INTRAMUSCULAR | Status: DC | PRN
Start: 2023-10-27 — End: 2023-10-27
  Administered 2023-10-27 (×2): 4 mg via INTRAVENOUS

## 2023-10-27 MED ORDER — SODIUM CHLORIDE 0.9 % IV SOLN: INTRAVENOUS | Status: DC

## 2023-10-27 NOTE — Anesthesia Postprocedure Evaluation (Signed)
 Anesthesia Post Note  Patient: Whitney Donovan  Procedure(s) Performed: COLONOSCOPY POLYPECTOMY, INTESTINE  Patient location during evaluation: PACU Anesthesia Type: General Level of consciousness: awake Pain management: satisfactory to patient Vital Signs Assessment: post-procedure vital signs reviewed and stable Respiratory status: spontaneous breathing Cardiovascular status: stable Anesthetic complications: no   No notable events documented.   Last Vitals:  Vitals:   10/27/23 0949 10/27/23 0954  BP:  103/78  Pulse:    Resp: 20 18  Temp:    SpO2:      Last Pain:  Vitals:   10/27/23 0954  TempSrc:   PainSc: 0-No pain                 VAN STAVEREN,Camila Maita

## 2023-10-27 NOTE — H&P (Signed)
 Corinn JONELLE Brooklyn, MD [facility], DHIP [address]  [place], [place] [address]  Main: [phone] Fax:  [phone] Pager: [phone]   Primary Care Physician:  Pcp, No Primary Gastroenterologist:  Dr. Corinn JONELLE Brooklyn  Pre-Procedure History & Physical: HPI:  Whitney Donovan is a 58 y.o. female is here for an colonoscopy.   Past Medical History:  Diagnosis Date   Anxiety    Complication of anesthesia    Depression    Diabetes mellitus without complication (HCC)    Hypothyroidism    Neuromuscular disorder (HCC)    PONV (postoperative nausea and vomiting)     Past Surgical History:  Procedure Laterality Date   CARPAL TUNNEL RELEASE Right 10/15/2018   Procedure: CARPAL TUNNEL RELEASE;  Surgeon: Cleotilde Barrio, MD;  Location: [facility] ORS;  Service: Orthopedics;  Laterality: Right;   CESAREAN SECTION     CHOLECYSTECTOMY N/A 06/04/2022   Procedure: LAPAROSCOPIC CHOLECYSTECTOMY;  Surgeon: Curvin Deward MOULD, MD;  Location: [facility] ORS;  Service: General;  Laterality: N/A;   EYE SURGERY     HYSTEROSCOPY WITH NOVASURE     TONSILLECTOMY     uterine ablation      Prior to Admission medications   Medication Sig Start Date End Date Taking? Authorizing Provider  alendronate (FOSAMAX) 70 MG tablet Take 70 mg by mouth once a week.   Yes [provider]  ergocalciferol (VITAMIN D2) 1.25 MG (50000 UT) capsule Take 50,000 Units by mouth once a week.   Yes [provider]  NP THYROID  90 MG tablet Take 90 mg by mouth every other day.   Yes [provider]  thyroid  (ARMOUR) 120 MG tablet Take 120 mg by mouth every other day.   Yes [provider]  ALPRAZolam (XANAX) 0.5 MG tablet Take 0.5 mg by mouth at bedtime.    [provider]  furosemide (LASIX) 40 MG tablet Take 40 mg by mouth daily.    [provider]  metFORMIN (GLUCOPHAGE-XR) 500 MG 24 hr tablet Take 500 mg by mouth 2 (two) times daily with a meal.    [provider]  traMADol  (ULTRAM ) 50 MG tablet Take 1 tablet (50 mg total) by mouth every 6 (six) hours as needed for moderate pain. 06/05/22   Curvin Deward MOULD, MD    Allergies as of 09/04/2023 - Review Complete 06/04/2022  Allergen Reaction Noted   Betadine [povidone iodine]  10/15/2018   Codeine Nausea Only 10/10/2018   Penicillins Rash 10/10/2018    Family History  Problem Relation Age of Onset   Healthy Mother    Dementia Mother    Pulmonary fibrosis Father    Breast cancer Neg Hx    Colon cancer Neg Hx    Esophageal cancer Neg Hx     Social History   Socioeconomic History   Marital status: Married    Spouse name: Not on file   Number of children: Not on file   Years of education: Not on file   Highest education level: Not on file  Occupational History   Not on file  Tobacco Use   Smoking status: Former    Current packs/day: 1.00    Average packs/day: 1 pack/day for 15.0 years (15.0 ttl pk-yrs)    Types: Cigarettes   Smokeless tobacco: Never  Vaping Use   Vaping status: Never Used  Substance and Sexual Activity   Alcohol use: Yes    Comment: OCCASIONAL WINE   Drug use: Never  Sexual activity: Not on file  Other Topics Concern   Not on file  Social History Narrative   Not on file   Social Drivers of Health   Financial Resource Strain: Low Risk  (10/10/2023)   Received from [facility]   Overall Financial Resource Strain (CARDIA)    Difficulty of Paying Living Expenses: Not hard at all  Food Insecurity: No Food Insecurity (10/10/2023)   Received from [facility]   Hunger Vital Sign    Within the past 12 months, you worried that your food would run out before you got the money to buy more.: Never true    Within the past 12 months, the food you bought just didn't last and you didn't have money to get more.: Never true  Transportation Needs: No Transportation Needs (10/10/2023)   Received from [facility]    In the past 12 months, has lack of transportation kept you from medical appointments or from getting medications?: No    Lack of Transportation (Non-Medical): No  Physical Activity: Not on file  Stress: Not on file  Social Connections: Not on file  Intimate Partner Violence: Not At Risk (06/04/2022)   Humiliation, Afraid, Rape, and Kick questionnaire    Fear of Current or Ex-Partner: No    Emotionally Abused: No    Physically Abused: No    Sexually Abused: No    Review of Systems: See HPI, otherwise negative ROS  Physical Exam: BP 130/82   Pulse 88   Temp (!) 95.3 F (35.2 C) (Temporal)   Resp 14   Ht 5' 5 (1.651 m)   Wt 69.1 kg   SpO2 100%   BMI 25.36 kg/m  General:   Alert,  pleasant and cooperative in NAD Head:  Normocephalic and atraumatic. Neck:  Supple; no masses or thyromegaly. Lungs:  Clear throughout to auscultation.    Heart:  Regular rate and rhythm. Abdomen:  Soft, nontender and nondistended. Normal bowel sounds, without guarding, and without rebound.   Neurologic:  Alert and  oriented x4;  grossly normal neurologically.  Impression/Plan: [name] is here for an colonoscopy to be performed for colon cancer screening  Risks, benefits, limitations, and alternatives regarding  colonoscopy have been reviewed with the patient.  Questions have been answered.  All parties agreeable.   Corinn Brooklyn, MD  10/27/2023, 8:29 AM

## 2023-10-27 NOTE — Op Note (Signed)
 [facility] Patient Name: Whitney Donovan Procedure Date: 10/27/2023 9:13 AM MRN: [id number] Account #: [id number] Date of Birth: Jan 28, 1965 Admit Type: Outpatient Age: 58 Room: [facility] ENDO ROOM 1 Gender: Female Note Status: Finalized Instrument Name: Peds Colonoscope [id number] Procedure:             Colonoscopy Indications:           Screening for colorectal malignant neoplasm, This is                         the patient's first colonoscopy Providers:             Corinn Jess Brooklyn MD, MD Referring MD:          No Local Md, MD (Referring MD) Medicines:             General Anesthesia Complications:         No immediate complications. Estimated blood loss: None. Procedure:             Pre-Anesthesia Assessment:                        - Prior to the procedure, a History and Physical was                         performed, and patient medications and allergies were                         reviewed. The patient is competent. The risks and                         benefits of the procedure and the sedation options and                         risks were discussed with the patient. All questions                         were answered and informed consent was obtained.                         Patient identification and proposed procedure were                         verified by the physician, the nurse, the                         anesthesiologist, the anesthetist and the technician                         in the pre-procedure area in the procedure room in the                         endoscopy suite. Mental Status Examination: alert and                         oriented. Airway Examination: normal oropharyngeal                         airway and neck mobility. Respiratory Examination:  clear to auscultation. CV Examination: normal.                         Prophylactic Antibiotics: The patient does not require                         prophylactic  antibiotics. Prior Anticoagulants: The                         patient has taken no anticoagulant or antiplatelet                         agents. ASA Grade Assessment: II - A patient with mild                         systemic disease. After reviewing the risks and                         benefits, the patient was deemed in satisfactory                         condition to undergo the procedure. The anesthesia                         plan was to use general anesthesia. Immediately prior                         to administration of medications, the patient was                         re-assessed for adequacy to receive sedatives. The                         heart rate, respiratory rate, oxygen saturations,                         blood pressure, adequacy of pulmonary ventilation, and                         response to care were monitored throughout the                         procedure. The physical status of the patient was                         re-assessed after the procedure.                        After obtaining informed consent, the colonoscope was                         passed under direct vision. Throughout the procedure,                         the patient's blood pressure, pulse, and oxygen                         saturations were monitored continuously. The  Colonoscope was introduced through the anus and                         advanced to the the terminal ileum, with                         identification of the appendiceal orifice and IC                         valve. The colonoscopy was performed without                         difficulty. The patient tolerated the procedure well.                         The quality of the bowel preparation was evaluated                         using the BBPS [facility] Bowel Preparation Scale) with                         scores of: Right Colon = 3, Transverse Colon = 3 and                         Left Colon = 3 (entire  mucosa seen well with no                         residual staining, small fragments of stool or opaque                         liquid). The total BBPS score equals 9. The terminal                         ileum, ileocecal valve, appendiceal orifice, and                         rectum were photographed. Findings:      The perianal and digital rectal examinations were normal. Pertinent       negatives include normal sphincter tone and no palpable rectal lesions.      Two sessile polyps were found in the sigmoid colon and proximal       ascending colon. The polyps were 4 to 5 mm in size. These polyps were       removed with a cold snare. Resection and retrieval were complete.       Estimated blood loss: none.      The retroflexed view of the distal rectum and anal verge was normal and       showed no anal or rectal abnormalities.      The terminal ileum appeared normal. Impression:            - Two 4 to 5 mm polyps in the sigmoid colon and in the                         proximal ascending colon, removed with a cold snare.  Resected and retrieved.                        - The distal rectum and anal verge are normal on                         retroflexion view.                        - The examined portion of the ileum was normal. Recommendation:        - Discharge patient to home (with escort).                        - Resume previous diet today.                        - Continue present medications.                        - Await pathology results.                        - Repeat colonoscopy in 5 years for surveillance. Procedure Code(s):     --- Professional ---                        [phone], Colonoscopy, flexible; with removal of                         tumor(s), polyp(s), or other lesion(s) by snare                         technique Diagnosis Code(s):     --- Professional ---                        Z12.11, Encounter for screening for malignant neoplasm                          of colon                        D12.5, Benign neoplasm of sigmoid colon                        D12.2, Benign neoplasm of ascending colon CPT copyright 2022 American Medical Association. All rights reserved. The codes documented in this report are preliminary and upon coder review may  be revised to meet current compliance requirements. Dr. Corinn Brooklyn Corinn Jess Brooklyn MD, MD 10/27/2023 9:37:35 AM This report has been signed electronically. Number of Addenda: 0 Note Initiated On: 10/27/2023 9:13 AM Scope Withdrawal Time: 0 hours 10 minutes 10 seconds  Total Procedure Duration: 0 hours 14 minutes 4 seconds  Estimated Blood Loss:  Estimated blood loss: none. Estimated blood loss: none.      [facility]

## 2023-10-27 NOTE — Anesthesia Preprocedure Evaluation (Signed)
 Anesthesia Evaluation  Patient identified by MRN, date of birth, ID band Patient awake    Reviewed: Allergy & Precautions, NPO status , Patient's Chart, lab work & pertinent test results  History of Anesthesia Complications (+) PONV and history of anesthetic complications  Airway Mallampati: II  TM Distance: >3 FB Neck ROM: full    Dental  (+) Teeth Intact   Pulmonary neg pulmonary ROS, former smoker   Pulmonary exam normal breath sounds clear to auscultation       Cardiovascular Exercise Tolerance: Good negative cardio ROS Normal cardiovascular exam Rhythm:Regular Rate:Normal     Neuro/Psych   Anxiety     negative neurological ROS  negative psych ROS   GI/Hepatic negative GI ROS, Neg liver ROS,,,  Endo/Other  negative endocrine ROSdiabetes, Type 2, Oral Hypoglycemic AgentsHypothyroidism    Renal/GU negative Renal ROS  negative genitourinary   Musculoskeletal negative musculoskeletal ROS (+)    Abdominal   Peds negative pediatric ROS (+)  Hematology negative hematology ROS (+)   Anesthesia Other Findings Past Medical History: No date: Anxiety No date: Complication of anesthesia No date: Depression No date: Diabetes mellitus without complication (HCC) No date: Hypothyroidism No date: Neuromuscular disorder (HCC) No date: PONV (postoperative nausea and vomiting)  Past Surgical History: 10/15/2018: CARPAL TUNNEL RELEASE; Right     Comment:  Procedure: CARPAL TUNNEL RELEASE;  Surgeon: Cleotilde Barrio, MD;  Location: [facility] ORS;  Service: Orthopedics;                Laterality: Right; No date: CESAREAN SECTION 06/04/2022: CHOLECYSTECTOMY; N/A     Comment:  Procedure: LAPAROSCOPIC CHOLECYSTECTOMY;  Surgeon: Curvin Deward MOULD, MD;  Location: [facility] ORS;  Service: General;                Laterality: N/A; No date: EYE SURGERY No date: HYSTEROSCOPY WITH NOVASURE No date: TONSILLECTOMY No date:  uterine ablation  BMI    Body Mass Index: 25.36 kg/m      Reproductive/Obstetrics negative OB ROS                              Anesthesia Physical Anesthesia Plan  ASA: 2  Anesthesia Plan: General   Post-op Pain Management:    Induction: Intravenous  PONV Risk Score and Plan: Propofol  infusion and TIVA  Airway Management Planned: Natural Airway and Nasal Cannula  Additional Equipment:   Intra-op Plan:   Post-operative Plan:   Informed Consent: I have reviewed the patients History and Physical, chart, labs and discussed the procedure including the risks, benefits and alternatives for the proposed anesthesia with the patient or authorized representative who has indicated his/her understanding and acceptance.     Dental Advisory Given  Plan Discussed with: CRNA  Anesthesia Plan Comments:         Anesthesia Quick Evaluation

## 2023-10-27 NOTE — Transfer of Care (Signed)
 Immediate Anesthesia Transfer of Care Note  Patient: Whitney Donovan  Procedure(s) Performed: COLONOSCOPY POLYPECTOMY, INTESTINE  Patient Location: PACU  Anesthesia Type:MAC  Level of Consciousness: awake, alert , and oriented  Airway & Oxygen Therapy: Patient Spontanous Breathing  Post-op Assessment: Report given to RN and Post -op Vital signs reviewed and stable  Post vital signs: stable  Last Vitals:  Vitals Value Taken Time  BP 99/53 10/27/23 09:39  Temp    Pulse 76 10/27/23 09:40  Resp 15 10/27/23 09:40  SpO2 99 % 10/27/23 09:40  Vitals shown include unfiled device data.  Last Pain:  Vitals:   10/27/23 0751  TempSrc: Temporal  PainSc: 0-No pain         Complications: No notable events documented.

## 2023-10-30 LAB — SURGICAL PATHOLOGY

## 2023-10-31 ENCOUNTER — Ambulatory Visit: Payer: Self-pay | Admitting: Gastroenterology

## 2023-10-31 NOTE — Progress Notes (Signed)
 Recommend surveillance colonoscopy in 5 years  RV

## 2024-07-15 ENCOUNTER — Ambulatory Visit: Admitting: Physician Assistant
